# Patient Record
Sex: Female | Born: 1980 | Race: White | Hispanic: No | Marital: Married | Smoking: Never smoker
Health system: Southern US, Community
[De-identification: ages and names within clinical notes are randomized; demographics above are authoritative.]

## PROBLEM LIST (undated history)

## (undated) DIAGNOSIS — I10 Essential (primary) hypertension: Secondary | ICD-10-CM

## (undated) DIAGNOSIS — O09529 Supervision of elderly multigravida, unspecified trimester: Secondary | ICD-10-CM

## (undated) DIAGNOSIS — N970 Female infertility associated with anovulation: Secondary | ICD-10-CM

## (undated) DIAGNOSIS — J45909 Unspecified asthma, uncomplicated: Secondary | ICD-10-CM

## (undated) DIAGNOSIS — O24419 Gestational diabetes mellitus in pregnancy, unspecified control: Secondary | ICD-10-CM

## (undated) DIAGNOSIS — E039 Hypothyroidism, unspecified: Secondary | ICD-10-CM

## (undated) HISTORY — DX: Unspecified asthma, uncomplicated: J45.909

## (undated) HISTORY — DX: Female infertility associated with anovulation: N97.0

## (undated) HISTORY — DX: Essential (primary) hypertension: I10

## (undated) HISTORY — PX: WISDOM TOOTH EXTRACTION: SHX21

---

## 2016-11-14 LAB — OB RESULTS CONSOLE RPR: RPR: NONREACTIVE

## 2016-11-14 LAB — OB RESULTS CONSOLE ANTIBODY SCREEN: Antibody Screen: NEGATIVE

## 2016-11-14 LAB — OB RESULTS CONSOLE HIV ANTIBODY (ROUTINE TESTING): HIV: NONREACTIVE

## 2016-11-14 LAB — OB RESULTS CONSOLE RUBELLA ANTIBODY, IGM: RUBELLA: IMMUNE

## 2016-11-14 LAB — OB RESULTS CONSOLE ABO/RH: RH TYPE: POSITIVE

## 2016-11-14 LAB — OB RESULTS CONSOLE HEPATITIS B SURFACE ANTIGEN: HEP B S AG: NEGATIVE

## 2017-07-07 LAB — OB RESULTS CONSOLE GBS: STREP GROUP B AG: NEGATIVE

## 2017-07-24 ENCOUNTER — Encounter (HOSPITAL_COMMUNITY): Payer: Self-pay

## 2017-07-24 ENCOUNTER — Inpatient Hospital Stay (HOSPITAL_COMMUNITY)
Admission: AD | Admit: 2017-07-24 | Discharge: 2017-07-27 | DRG: 787 | Disposition: A | Discharge status: Home / Self Care | Payer: PRIVATE HEALTH INSURANCE | Source: Ambulatory Visit | Attending: Obstetrics and Gynecology | Admitting: Obstetrics and Gynecology

## 2017-07-24 DIAGNOSIS — O4103X0 Oligohydramnios, third trimester, not applicable or unspecified: Secondary | ICD-10-CM | POA: Diagnosis present

## 2017-07-24 DIAGNOSIS — Z3A39 39 weeks gestation of pregnancy: Secondary | ICD-10-CM | POA: Diagnosis not present

## 2017-07-24 DIAGNOSIS — O134 Gestational [pregnancy-induced] hypertension without significant proteinuria, complicating childbirth: Principal | ICD-10-CM | POA: Diagnosis present

## 2017-07-24 DIAGNOSIS — O99214 Obesity complicating childbirth: Secondary | ICD-10-CM | POA: Diagnosis present

## 2017-07-24 DIAGNOSIS — E039 Hypothyroidism, unspecified: Secondary | ICD-10-CM | POA: Diagnosis present

## 2017-07-24 DIAGNOSIS — E669 Obesity, unspecified: Secondary | ICD-10-CM | POA: Diagnosis present

## 2017-07-24 DIAGNOSIS — O99284 Endocrine, nutritional and metabolic diseases complicating childbirth: Secondary | ICD-10-CM | POA: Diagnosis present

## 2017-07-24 DIAGNOSIS — O365930 Maternal care for other known or suspected poor fetal growth, third trimester, not applicable or unspecified: Secondary | ICD-10-CM | POA: Diagnosis present

## 2017-07-24 DIAGNOSIS — Z6832 Body mass index (BMI) 32.0-32.9, adult: Secondary | ICD-10-CM

## 2017-07-24 DIAGNOSIS — O4100X0 Oligohydramnios, unspecified trimester, not applicable or unspecified: Secondary | ICD-10-CM | POA: Diagnosis present

## 2017-07-24 DIAGNOSIS — O169 Unspecified maternal hypertension, unspecified trimester: Secondary | ICD-10-CM | POA: Diagnosis present

## 2017-07-24 HISTORY — DX: Hypothyroidism, unspecified: E03.9

## 2017-07-24 LAB — COMPREHENSIVE METABOLIC PANEL
ALT: 15 U/L (ref 14–54)
ANION GAP: 12 (ref 5–15)
AST: 21 U/L (ref 15–41)
Albumin: 2.9 g/dL — ABNORMAL LOW (ref 3.5–5.0)
Alkaline Phosphatase: 160 U/L — ABNORMAL HIGH (ref 38–126)
BUN: 9 mg/dL (ref 6–20)
CALCIUM: 8.6 mg/dL — AB (ref 8.9–10.3)
CHLORIDE: 105 mmol/L (ref 101–111)
CO2: 20 mmol/L — ABNORMAL LOW (ref 22–32)
CREATININE: 0.5 mg/dL (ref 0.44–1.00)
Glucose, Bld: 85 mg/dL (ref 65–99)
Potassium: 3.4 mmol/L — ABNORMAL LOW (ref 3.5–5.1)
SODIUM: 137 mmol/L (ref 135–145)
Total Bilirubin: 0.2 mg/dL — ABNORMAL LOW (ref 0.3–1.2)
Total Protein: 6.6 g/dL (ref 6.5–8.1)

## 2017-07-24 LAB — CBC
HCT: 39.9 % (ref 36.0–46.0)
Hemoglobin: 13.7 g/dL (ref 12.0–15.0)
MCH: 30.7 pg (ref 26.0–34.0)
MCHC: 34.3 g/dL (ref 30.0–36.0)
MCV: 89.5 fL (ref 78.0–100.0)
PLATELETS: 270 10*3/uL (ref 150–400)
RBC: 4.46 MIL/uL (ref 3.87–5.11)
RDW: 13.9 % (ref 11.5–15.5)
WBC: 14.2 10*3/uL — AB (ref 4.0–10.5)

## 2017-07-24 LAB — TYPE AND SCREEN
ABO/RH(D): O POS
Antibody Screen: NEGATIVE

## 2017-07-24 LAB — ABO/RH: ABO/RH(D): O POS

## 2017-07-24 MED ORDER — LACTATED RINGERS IV SOLN
500.0000 mL | INTRAVENOUS | Status: DC | PRN
  Administered 2017-07-25: 500 mL via INTRAVENOUS

## 2017-07-24 MED ORDER — FLEET ENEMA 7-19 GM/118ML RE ENEM: 1.0000 | ENEMA | Freq: Every day | RECTAL | Status: DC | PRN

## 2017-07-24 MED ORDER — ACETAMINOPHEN 325 MG PO TABS: 650.0000 mg | ORAL_TABLET | ORAL | Status: DC | PRN

## 2017-07-24 MED ORDER — SOD CITRATE-CITRIC ACID 500-334 MG/5ML PO SOLN
30.0000 mL | ORAL | Status: DC | PRN
  Administered 2017-07-25: 30 mL via ORAL
  Filled 2017-07-24: qty 15

## 2017-07-24 MED ORDER — MISOPROSTOL 25 MCG QUARTER TABLET
25.0000 ug | ORAL_TABLET | ORAL | Status: DC | PRN
  Administered 2017-07-24 – 2017-07-25 (×3): 25 ug via VAGINAL
  Filled 2017-07-24 (×3): qty 1

## 2017-07-24 MED ORDER — LACTATED RINGERS IV SOLN
INTRAVENOUS | Status: DC
  Administered 2017-07-24 – 2017-07-25 (×3): via INTRAVENOUS
  Administered 2017-07-25: 125 mL/h via INTRAVENOUS
  Administered 2017-07-25: 06:00:00 via INTRAVENOUS

## 2017-07-24 MED ORDER — TERBUTALINE SULFATE 1 MG/ML IJ SOLN: 0.2500 mg | Freq: Once | INTRAMUSCULAR | Status: DC | PRN

## 2017-07-24 MED ORDER — OXYCODONE-ACETAMINOPHEN 5-325 MG PO TABS: 2.0000 | ORAL_TABLET | ORAL | Status: DC | PRN

## 2017-07-24 MED ORDER — OXYCODONE-ACETAMINOPHEN 5-325 MG PO TABS: 1.0000 | ORAL_TABLET | ORAL | Status: DC | PRN

## 2017-07-24 MED ORDER — OXYTOCIN BOLUS FROM INFUSION: 500.0000 mL | Freq: Once | INTRAVENOUS | Status: DC

## 2017-07-24 MED ORDER — LIDOCAINE HCL (PF) 1 % IJ SOLN: 30.0000 mL | INTRAMUSCULAR | Status: DC | PRN

## 2017-07-24 MED ORDER — OXYTOCIN 40 UNITS IN LACTATED RINGERS INFUSION - SIMPLE MED
2.5000 [IU]/h | INTRAVENOUS | Status: DC
  Filled 2017-07-24: qty 1000

## 2017-07-24 MED ORDER — ONDANSETRON HCL 4 MG/2ML IJ SOLN: 4.0000 mg | Freq: Four times a day (QID) | INTRAMUSCULAR | Status: DC | PRN

## 2017-07-24 NOTE — Anesthesia Pain Management Evaluation Note (Signed)
  CRNA Pain Management Visit Note  Patient: Donna Page, 36 y.o., female  "Hello I am a member of the anesthesia team at [facility]. We have an anesthesia team available at all times to provide care throughout the hospital, including epidural management and anesthesia for C-section. I don't know your plan for the delivery whether it a natural birth, water birth, IV sedation, nitrous supplementation, doula or epidural, but we want to meet your pain goals."   1.Was your pain managed to your expectations on prior hospitalizations?   No prior hospitalizations  2.What is your expectation for pain management during this hospitalization?     Labor support without medications, Epidural, IV pain meds and Nitrous Oxide  3.How can we help you reach that goal? This is patient's first baby and she open to discussion of all pain control methods. All questions answered.  Record the patient's initial score and the patient's pain goal.   Pain: 0  Pain Goal: 5 The [facility] wants you to be able to say your pain was always managed very well.  Donna Page 07/24/2017

## 2017-07-24 NOTE — Anesthesia Pain Management Evaluation Note (Signed)
  CRNA Pain Management Visit Note  Patient: Donna Page, 36 y.o., female  "Hello I am a member of the anesthesia team at [facility]. We have an anesthesia team available at all times to provide care throughout the hospital, including epidural management and anesthesia for C-section. I don't know your plan for the delivery whether it a natural birth, water birth, IV sedation, nitrous supplementation, doula or epidural, but we want to meet your pain goals."   1.Was your pain managed to your expectations on prior hospitalizations?   No prior hospitalizations  2.What is your expectation for pain management during this hospitalization?     Epidural and IV pain meds  3.How can we help you reach that goal? Be available  Record the patient's initial score and the patient's pain goal.   Pain: 0  Pain Goal: 5 The [facility] wants you to be able to say your pain was always managed very well.  [facility] 07/24/2017

## 2017-07-24 NOTE — H&P (Signed)
Donna Page is a 36 y.o. female presenting for IOL for gestational HTN and oligo with AFI 2 today.. OB History    Gravida Para Term Preterm AB Living   2       1     SAB TAB Ectopic Multiple Live Births   1       0     Past Medical History:  Diagnosis Date  . Hypothyroidism    No past surgical history on file. Family History: family history is not on file. Social History:  reports that she has never smoked. She has never used smokeless tobacco. She reports that she does not use drugs. Her alcohol history is not on file.     Maternal Diabetes: No Genetic Screening: Normal Maternal Ultrasounds/Referrals: Normal Fetal Ultrasounds or other Referrals:  None Maternal Substance Abuse:  No Significant Maternal Medications:  None Significant Maternal Lab Results:  None Other Comments:  None  Review of Systems  Constitutional: Negative.   All other systems reviewed and are negative.  Maternal Medical History:  Fetal activity: Perceived fetal activity is normal.   Last perceived fetal movement was within the past hour.    Prenatal complications: PIH and oligohydramnios.   Prenatal Complications - Diabetes: none.    Dilation: Closed Effacement (%): Thick Station: -3 Exam by:: Montez Morita, RNC Blood pressure (!) 144/83, pulse 83, temperature 99.1 F (37.3 C), temperature source Oral, resp. rate 18, height  (1.676 m), weight 93 kg (205 lb). Maternal Exam:  Uterine Assessment: Contraction strength is mild.  Contraction frequency is rare.   Abdomen: Patient reports no abdominal tenderness. Fetal presentation: vertex  Introitus: Normal vulva. Normal vagina.  Ferning test: negative.  Nitrazine test: negative. Amniotic fluid character: not assessed.  Pelvis: adequate for delivery.   Cervix: Cervix evaluated by digital exam.     Physical Exam  Nursing note and vitals reviewed. Constitutional: She is oriented to person, place, and time. She appears well-developed and  well-nourished.  HENT:  Head: Normocephalic and atraumatic.  Neck: Normal range of motion. Neck supple.  Cardiovascular: Normal rate and regular rhythm.   Respiratory: Breath sounds normal.  GI: Soft. Bowel sounds are normal.  Genitourinary: Vagina normal and uterus normal.  Musculoskeletal: Normal range of motion.  Neurological: She is alert and oriented to person, place, and time. She has normal reflexes.  Skin: Skin is dry.  Psychiatric: She has a normal mood and affect.    Prenatal labs: ABO, Rh: --/--/O POS (10/12 1556) Antibody: NEG (10/12 1556) Rubella: Immune (02/02 0000) RPR: Nonreactive (02/02 0000)  HBsAg: Negative (02/02 0000)  HIV: Non-reactive (02/02 0000)  GBS: Negative (09/25 0000)   Assessment/Plan: Gestational HTn Oligo IOL   Mika Anastasi J 07/24/2017, 8:34 PM

## 2017-07-25 ENCOUNTER — Encounter (HOSPITAL_COMMUNITY)
Admission: AD | Disposition: A | Discharge status: Home / Self Care | Payer: Self-pay | Source: Ambulatory Visit | Attending: Obstetrics and Gynecology

## 2017-07-25 ENCOUNTER — Encounter (HOSPITAL_COMMUNITY): Payer: Self-pay | Admitting: *Deleted

## 2017-07-25 ENCOUNTER — Inpatient Hospital Stay (HOSPITAL_COMMUNITY): Payer: PRIVATE HEALTH INSURANCE | Admitting: Anesthesiology

## 2017-07-25 LAB — CBC
HEMATOCRIT: 38.1 % (ref 36.0–46.0)
Hemoglobin: 13.1 g/dL (ref 12.0–15.0)
MCH: 30.5 pg (ref 26.0–34.0)
MCHC: 34.4 g/dL (ref 30.0–36.0)
MCV: 88.6 fL (ref 78.0–100.0)
Platelets: 248 10*3/uL (ref 150–400)
RBC: 4.3 MIL/uL (ref 3.87–5.11)
RDW: 14 % (ref 11.5–15.5)
WBC: 14.9 10*3/uL — AB (ref 4.0–10.5)

## 2017-07-25 LAB — RPR: RPR Ser Ql: NONREACTIVE

## 2017-07-25 SURGERY — Surgical Case
Anesthesia: Regional | Site: Abdomen | Wound class: Clean Contaminated

## 2017-07-25 MED ORDER — BUPIVACAINE HCL (PF) 0.25 % IJ SOLN
INTRAMUSCULAR | Status: AC
  Filled 2017-07-25: qty 30

## 2017-07-25 MED ORDER — NALBUPHINE HCL 10 MG/ML IJ SOLN: 5.0000 mg | Freq: Once | INTRAMUSCULAR | Status: DC | PRN

## 2017-07-25 MED ORDER — DEXAMETHASONE SODIUM PHOSPHATE 4 MG/ML IJ SOLN
INTRAMUSCULAR | Status: DC | PRN
  Administered 2017-07-25: 4 mg via INTRAVENOUS

## 2017-07-25 MED ORDER — EPHEDRINE SULFATE 50 MG/ML IJ SOLN
INTRAMUSCULAR | Status: DC | PRN
  Administered 2017-07-25 (×2): 25 mg via INTRAVENOUS

## 2017-07-25 MED ORDER — ACETAMINOPHEN 325 MG PO TABS
650.0000 mg | ORAL_TABLET | ORAL | Status: DC | PRN
  Administered 2017-07-25 – 2017-07-27 (×4): 650 mg via ORAL
  Filled 2017-07-25 (×4): qty 2

## 2017-07-25 MED ORDER — PHENYLEPHRINE 40 MCG/ML (10ML) SYRINGE FOR IV PUSH (FOR BLOOD PRESSURE SUPPORT)
80.0000 ug | PREFILLED_SYRINGE | INTRAVENOUS | Status: DC | PRN
  Filled 2017-07-25: qty 10

## 2017-07-25 MED ORDER — SENNOSIDES-DOCUSATE SODIUM 8.6-50 MG PO TABS
2.0000 | ORAL_TABLET | ORAL | Status: DC
  Administered 2017-07-25 – 2017-07-26 (×2): 2 via ORAL
  Filled 2017-07-25 (×2): qty 2

## 2017-07-25 MED ORDER — MENTHOL 3 MG MT LOZG: 1.0000 | LOZENGE | OROMUCOSAL | Status: DC | PRN

## 2017-07-25 MED ORDER — SIMETHICONE 80 MG PO CHEW
80.0000 mg | CHEWABLE_TABLET | Freq: Three times a day (TID) | ORAL | Status: DC
  Administered 2017-07-26 – 2017-07-27 (×3): 80 mg via ORAL
  Filled 2017-07-25 (×3): qty 1

## 2017-07-25 MED ORDER — DIPHENHYDRAMINE HCL 50 MG/ML IJ SOLN: 12.5000 mg | INTRAMUSCULAR | Status: DC | PRN

## 2017-07-25 MED ORDER — ONDANSETRON HCL 4 MG/2ML IJ SOLN
INTRAMUSCULAR | Status: AC
  Filled 2017-07-25: qty 2

## 2017-07-25 MED ORDER — EPHEDRINE 5 MG/ML INJ: 10.0000 mg | INTRAVENOUS | Status: DC | PRN

## 2017-07-25 MED ORDER — NALBUPHINE HCL 10 MG/ML IJ SOLN: 5.0000 mg | INTRAMUSCULAR | Status: DC | PRN

## 2017-07-25 MED ORDER — SODIUM CHLORIDE 0.9 % IJ SOLN
INTRAMUSCULAR | Status: AC
  Filled 2017-07-25: qty 20

## 2017-07-25 MED ORDER — WITCH HAZEL-GLYCERIN EX PADS: 1.0000 "application " | MEDICATED_PAD | CUTANEOUS | Status: DC | PRN

## 2017-07-25 MED ORDER — OXYCODONE HCL 5 MG/5ML PO SOLN: 5.0000 mg | Freq: Once | ORAL | Status: DC | PRN

## 2017-07-25 MED ORDER — SCOPOLAMINE 1 MG/3DAYS TD PT72
1.0000 | MEDICATED_PATCH | Freq: Once | TRANSDERMAL | Status: DC
  Filled 2017-07-25: qty 1

## 2017-07-25 MED ORDER — SODIUM CHLORIDE 0.9 % IR SOLN
Status: DC | PRN
  Administered 2017-07-25: 1

## 2017-07-25 MED ORDER — LEVOTHYROXINE SODIUM 75 MCG PO TABS
75.0000 ug | ORAL_TABLET | Freq: Every day | ORAL | Status: DC
  Filled 2017-07-25 (×3): qty 1

## 2017-07-25 MED ORDER — OXYCODONE-ACETAMINOPHEN 5-325 MG PO TABS: 1.0000 | ORAL_TABLET | ORAL | Status: DC | PRN

## 2017-07-25 MED ORDER — DIBUCAINE 1 % RE OINT: 1.0000 "application " | TOPICAL_OINTMENT | RECTAL | Status: DC | PRN

## 2017-07-25 MED ORDER — MEPERIDINE HCL 25 MG/ML IJ SOLN: 6.2500 mg | INTRAMUSCULAR | Status: DC | PRN

## 2017-07-25 MED ORDER — PROMETHAZINE HCL 25 MG/ML IJ SOLN: 6.2500 mg | INTRAMUSCULAR | Status: DC | PRN

## 2017-07-25 MED ORDER — OXYTOCIN 40 UNITS IN LACTATED RINGERS INFUSION - SIMPLE MED
1.0000 m[IU]/min | INTRAVENOUS | Status: DC
  Administered 2017-07-25: 2 m[IU]/min via INTRAVENOUS

## 2017-07-25 MED ORDER — TETANUS-DIPHTH-ACELL PERTUSSIS 5-2.5-18.5 LF-MCG/0.5 IM SUSP: 0.5000 mL | Freq: Once | INTRAMUSCULAR | Status: DC

## 2017-07-25 MED ORDER — KETOROLAC TROMETHAMINE 30 MG/ML IJ SOLN: 30.0000 mg | Freq: Once | INTRAMUSCULAR | Status: DC | PRN

## 2017-07-25 MED ORDER — SIMETHICONE 80 MG PO CHEW: 80.0000 mg | CHEWABLE_TABLET | ORAL | Status: DC | PRN

## 2017-07-25 MED ORDER — COCONUT OIL OIL: 1.0000 "application " | TOPICAL_OIL | Status: DC | PRN

## 2017-07-25 MED ORDER — EPHEDRINE 5 MG/ML INJ
INTRAVENOUS | Status: AC
  Filled 2017-07-25: qty 10

## 2017-07-25 MED ORDER — LACTATED RINGERS IV SOLN
INTRAVENOUS | Status: DC
  Administered 2017-07-25: 999 mL via INTRAVENOUS

## 2017-07-25 MED ORDER — SODIUM BICARBONATE 8.4 % IV SOLN
INTRAVENOUS | Status: DC | PRN
  Administered 2017-07-25: 2 mL via EPIDURAL
  Administered 2017-07-25: 5 mL via EPIDURAL

## 2017-07-25 MED ORDER — LACTATED RINGERS IV SOLN
INTRAVENOUS | Status: DC | PRN
Start: 2017-07-25 — End: 2017-07-25
  Administered 2017-07-25 (×2): via INTRAVENOUS

## 2017-07-25 MED ORDER — PRENATAL MULTIVITAMIN CH
1.0000 | ORAL_TABLET | Freq: Every day | ORAL | Status: DC
  Administered 2017-07-26 – 2017-07-27 (×2): 1 via ORAL
  Filled 2017-07-25 (×2): qty 1

## 2017-07-25 MED ORDER — TERBUTALINE SULFATE 1 MG/ML IJ SOLN: 0.2500 mg | Freq: Once | INTRAMUSCULAR | Status: DC | PRN

## 2017-07-25 MED ORDER — LACTATED RINGERS IV SOLN
INTRAVENOUS | Status: DC
  Administered 2017-07-25: 150 mL/h via INTRAUTERINE

## 2017-07-25 MED ORDER — DEXAMETHASONE SODIUM PHOSPHATE 4 MG/ML IJ SOLN
INTRAMUSCULAR | Status: AC
  Filled 2017-07-25: qty 1

## 2017-07-25 MED ORDER — METHYLERGONOVINE MALEATE 0.2 MG PO TABS: 0.2000 mg | ORAL_TABLET | ORAL | Status: DC | PRN

## 2017-07-25 MED ORDER — ATROPINE SULFATE 0.4 MG/ML IJ SOLN
INTRAMUSCULAR | Status: AC
  Filled 2017-07-25: qty 1

## 2017-07-25 MED ORDER — HYDROMORPHONE HCL 1 MG/ML IJ SOLN: 0.2500 mg | INTRAMUSCULAR | Status: DC | PRN

## 2017-07-25 MED ORDER — FENTANYL 2.5 MCG/ML BUPIVACAINE 1/10 % EPIDURAL INFUSION (WH - ANES)
14.0000 mL/h | INTRAMUSCULAR | Status: DC | PRN
Start: 2017-07-25 — End: 2017-07-25
  Administered 2017-07-25 (×2): 14 mL/h via EPIDURAL
  Filled 2017-07-25 (×2): qty 100

## 2017-07-25 MED ORDER — IBUPROFEN 600 MG PO TABS
600.0000 mg | ORAL_TABLET | Freq: Four times a day (QID) | ORAL | Status: DC
  Administered 2017-07-25 – 2017-07-27 (×8): 600 mg via ORAL
  Filled 2017-07-25 (×8): qty 1

## 2017-07-25 MED ORDER — NALBUPHINE HCL 10 MG/ML IJ SOLN
5.0000 mg | INTRAMUSCULAR | Status: DC | PRN
  Administered 2017-07-25: 5 mg via SUBCUTANEOUS
  Filled 2017-07-25: qty 1

## 2017-07-25 MED ORDER — DIPHENHYDRAMINE HCL 25 MG PO CAPS: 25.0000 mg | ORAL_CAPSULE | Freq: Four times a day (QID) | ORAL | Status: DC | PRN

## 2017-07-25 MED ORDER — NALOXONE HCL 0.4 MG/ML IJ SOLN: 0.4000 mg | INTRAMUSCULAR | Status: DC | PRN

## 2017-07-25 MED ORDER — BUPIVACAINE HCL (PF) 0.25 % IJ SOLN
INTRAMUSCULAR | Status: DC | PRN
  Administered 2017-07-25: 20 mL

## 2017-07-25 MED ORDER — LACTATED RINGERS IV SOLN
500.0000 mL | Freq: Once | INTRAVENOUS | Status: AC
  Administered 2017-07-25: 500 mL via INTRAVENOUS

## 2017-07-25 MED ORDER — LACTATED RINGERS IV SOLN: 500.0000 mL | Freq: Once | INTRAVENOUS | Status: DC

## 2017-07-25 MED ORDER — ATROPINE SULFATE 0.4 MG/ML IJ SOLN
INTRAMUSCULAR | Status: DC | PRN
  Administered 2017-07-25: 0.4 mg via INTRAVENOUS

## 2017-07-25 MED ORDER — MORPHINE SULFATE (PF) 0.5 MG/ML IJ SOLN
INTRAMUSCULAR | Status: DC | PRN
  Administered 2017-07-25: 4 mg via EPIDURAL

## 2017-07-25 MED ORDER — KETOROLAC TROMETHAMINE 30 MG/ML IJ SOLN
INTRAMUSCULAR | Status: AC
  Administered 2017-07-25: 30 mg
  Filled 2017-07-25: qty 1

## 2017-07-25 MED ORDER — OXYCODONE-ACETAMINOPHEN 5-325 MG PO TABS: 2.0000 | ORAL_TABLET | ORAL | Status: DC | PRN

## 2017-07-25 MED ORDER — SODIUM CHLORIDE 0.9% FLUSH: 3.0000 mL | INTRAVENOUS | Status: DC | PRN

## 2017-07-25 MED ORDER — OXYTOCIN 40 UNITS IN LACTATED RINGERS INFUSION - SIMPLE MED: 2.5000 [IU]/h | INTRAVENOUS | Status: AC

## 2017-07-25 MED ORDER — SCOPOLAMINE 1 MG/3DAYS TD PT72
MEDICATED_PATCH | TRANSDERMAL | Status: AC
  Filled 2017-07-25: qty 1

## 2017-07-25 MED ORDER — LACTATED RINGERS IV SOLN
INTRAVENOUS | Status: DC | PRN
  Administered 2017-07-25: 13:00:00 via INTRAVENOUS

## 2017-07-25 MED ORDER — DEXAMETHASONE SODIUM PHOSPHATE 10 MG/ML IJ SOLN
INTRAMUSCULAR | Status: AC
  Filled 2017-07-25: qty 2

## 2017-07-25 MED ORDER — SCOPOLAMINE 1 MG/3DAYS TD PT72
MEDICATED_PATCH | TRANSDERMAL | Status: DC | PRN
  Administered 2017-07-25: 1 via TRANSDERMAL

## 2017-07-25 MED ORDER — ZOLPIDEM TARTRATE 5 MG PO TABS: 5.0000 mg | ORAL_TABLET | Freq: Every evening | ORAL | Status: DC | PRN

## 2017-07-25 MED ORDER — OXYCODONE HCL 5 MG PO TABS: 5.0000 mg | ORAL_TABLET | Freq: Once | ORAL | Status: DC | PRN

## 2017-07-25 MED ORDER — ONDANSETRON HCL 4 MG/2ML IJ SOLN: 4.0000 mg | Freq: Three times a day (TID) | INTRAMUSCULAR | Status: DC | PRN

## 2017-07-25 MED ORDER — METHYLERGONOVINE MALEATE 0.2 MG/ML IJ SOLN: 0.2000 mg | INTRAMUSCULAR | Status: DC | PRN

## 2017-07-25 MED ORDER — PHENYLEPHRINE 40 MCG/ML (10ML) SYRINGE FOR IV PUSH (FOR BLOOD PRESSURE SUPPORT): 80.0000 ug | PREFILLED_SYRINGE | INTRAVENOUS | Status: DC | PRN

## 2017-07-25 MED ORDER — NALOXONE HCL 2 MG/2ML IJ SOSY
1.0000 ug/kg/h | PREFILLED_SYRINGE | INTRAVENOUS | Status: DC | PRN
  Filled 2017-07-25: qty 2

## 2017-07-25 MED ORDER — LIDOCAINE HCL (PF) 1 % IJ SOLN
INTRAMUSCULAR | Status: DC | PRN
Start: 2017-07-25 — End: 2017-07-25
  Administered 2017-07-25: 5 mL via EPIDURAL
  Administered 2017-07-25: 3 mL via EPIDURAL
  Administered 2017-07-25: 2 mL via EPIDURAL

## 2017-07-25 MED ORDER — MORPHINE SULFATE (PF) 0.5 MG/ML IJ SOLN
INTRAMUSCULAR | Status: AC
  Filled 2017-07-25: qty 10

## 2017-07-25 MED ORDER — SIMETHICONE 80 MG PO CHEW
80.0000 mg | CHEWABLE_TABLET | ORAL | Status: DC
  Administered 2017-07-25 – 2017-07-26 (×2): 80 mg via ORAL
  Filled 2017-07-25 (×2): qty 1

## 2017-07-25 MED ORDER — ONDANSETRON HCL 4 MG/2ML IJ SOLN
INTRAMUSCULAR | Status: DC | PRN
  Administered 2017-07-25: 4 mg via INTRAVENOUS

## 2017-07-25 MED ORDER — DIPHENHYDRAMINE HCL 25 MG PO CAPS: 25.0000 mg | ORAL_CAPSULE | ORAL | Status: DC | PRN

## 2017-07-25 MED ORDER — CEFAZOLIN SODIUM-DEXTROSE 2-3 GM-% IV SOLR
INTRAVENOUS | Status: DC | PRN
  Administered 2017-07-25: 2 g via INTRAVENOUS

## 2017-07-25 MED ORDER — OXYTOCIN 10 UNIT/ML IJ SOLN
INTRAVENOUS | Status: DC | PRN
  Administered 2017-07-25: 40 [IU] via INTRAVENOUS
  Administered 2017-07-25: 13:00:00 via INTRAVENOUS

## 2017-07-25 SURGICAL SUPPLY — 35 items
CHLORAPREP W/TINT 26ML (MISCELLANEOUS) ×3 IMPLANT
CLAMP CORD UMBIL (MISCELLANEOUS) IMPLANT
CLOTH BEACON ORANGE TIMEOUT ST (SAFETY) ×3 IMPLANT
CONTAINER PREFILL 10% NBF 15ML (MISCELLANEOUS) IMPLANT
DERMABOND ADVANCED (GAUZE/BANDAGES/DRESSINGS) ×2
DERMABOND ADVANCED .7 DNX12 (GAUZE/BANDAGES/DRESSINGS) ×1 IMPLANT
DRSG OPSITE POSTOP 4X10 (GAUZE/BANDAGES/DRESSINGS) ×3 IMPLANT
ELECT REM PT RETURN 9FT ADLT (ELECTROSURGICAL) ×3
ELECTRODE REM PT RTRN 9FT ADLT (ELECTROSURGICAL) ×1 IMPLANT
EXTRACTOR VACUUM M CUP 4 TUBE (SUCTIONS) IMPLANT
EXTRACTOR VACUUM M CUP 4' TUBE (SUCTIONS)
GLOVE BIO SURGEON STRL SZ7.5 (GLOVE) ×3 IMPLANT
GLOVE BIOGEL PI IND STRL 7.0 (GLOVE) ×1 IMPLANT
GLOVE BIOGEL PI INDICATOR 7.0 (GLOVE) ×2
GOWN STRL REUS W/TWL LRG LVL3 (GOWN DISPOSABLE) ×6 IMPLANT
KIT ABG SYR 3ML LUER SLIP (SYRINGE) IMPLANT
NEEDLE HYPO 22GX1.5 SAFETY (NEEDLE) ×3 IMPLANT
NEEDLE HYPO 25X5/8 SAFETYGLIDE (NEEDLE) IMPLANT
NEEDLE SPNL 20GX3.5 QUINCKE YW (NEEDLE) IMPLANT
NS IRRIG 1000ML POUR BTL (IV SOLUTION) ×3 IMPLANT
PACK C SECTION WH (CUSTOM PROCEDURE TRAY) ×3 IMPLANT
PENCIL SMOKE EVAC W/HOLSTER (ELECTROSURGICAL) ×3 IMPLANT
SUT MNCRL 0 VIOLET CTX 36 (SUTURE) ×2 IMPLANT
SUT MNCRL AB 3-0 PS2 27 (SUTURE) IMPLANT
SUT MON AB 2-0 CT1 27 (SUTURE) ×3 IMPLANT
SUT MON AB-0 CT1 36 (SUTURE) ×6 IMPLANT
SUT MONOCRYL 0 CTX 36 (SUTURE) ×4
SUT PLAIN 0 NONE (SUTURE) IMPLANT
SUT PLAIN 2 0 (SUTURE)
SUT PLAIN 2 0 XLH (SUTURE) IMPLANT
SUT PLAIN ABS 2-0 CT1 27XMFL (SUTURE) IMPLANT
SYR 20CC LL (SYRINGE) IMPLANT
SYR CONTROL 10ML LL (SYRINGE) ×3 IMPLANT
TOWEL OR 17X24 6PK STRL BLUE (TOWEL DISPOSABLE) ×3 IMPLANT
TRAY FOLEY BAG SILVER LF 14FR (SET/KITS/TRAYS/PACK) ×3 IMPLANT

## 2017-07-25 NOTE — Op Note (Signed)
Cesarean Section Procedure Note  Indications: non-reassuring fetal status  Pre-operative Diagnosis: 39 week 1 day pregnancy.  Post-operative Diagnosis: same  Surgeon: Lenoard Aden   Assistants: Renae Fickle, CNM  Anesthesia: Epidural anesthesia and Local anesthesia 0.25.% bupivacaine  ASA Class: 2  Procedure Details  The patient was seen in the Holding Room. The risks, benefits, complications, treatment options, and expected outcomes were discussed with the patient.  The patient concurred with the proposed plan, giving informed consent. The risks of anesthesia, infection, bleeding and possible injury to other organs discussed. Injury to bowel, bladder, or ureter with possible need for repair discussed. Possible need for transfusion with secondary risks of hepatitis or HIV acquisition discussed. Post operative complications to include but not limited to DVT, PE and Pneumonia noted. The site of surgery properly noted/marked. The patient was taken to Operating Room # 9, identified as Donna Page and the procedure verified as C-Section Delivery. A Time Out was held and the above information confirmed.  After induction of anesthesia, the patient was draped and prepped in the usual sterile manner. A Pfannenstiel incision was made and carried down through the subcutaneous tissue to the fascia. Fascial incision was made and extended transversely using Mayo scissors. The fascia was separated from the underlying rectus tissue superiorly and inferiorly. The peritoneum was identified and entered. Peritoneal incision was extended longitudinally. The utero-vesical peritoneal reflection was incised transversely and the bladder flap was bluntly freed from the lower uterine segment. A low transverse uterine incision(Kerr hysterotomy) was made. Delivered from OP presentation was a  female with Apgar scores of 8 at one minute and 9 at five minutes. Bulb suctioning gently performed. Neonatal team in attendance.After the  umbilical cord was clamped and cut cord blood was obtained for evaluation. The placenta was removed intact and appeared normal. The uterus was curetted with a dry lap pack. Good hemostasis was noted.The uterine outline, tubes and ovaries appeared normal. The uterine incision was closed with running locked sutures of 0 Monocryl x 2 layers. Hemostasis was observed. Lavage was carried out until clear.The parietal peritoneum was closed with a running 2-0 Monocryl suture. The fascia was then reapproximated with running sutures of 0 Monocryl. The skin was reapproximated with 4-0 vicryl after 2-0 plain in a running fashion.  Instrument, sponge, and needle counts were correct prior the abdominal closure and at the conclusion of the case.   Findings: FTLM, OP, anterior placenta  Estimated Blood Loss:  1100         Drains: foley                 Specimens: placenta                  Complications:  None; patient tolerated the procedure well.         Disposition: na         Condition: stable  Attending Attestation: I performed the procedure.

## 2017-07-25 NOTE — Lactation Note (Addendum)
This note was copied from a baby's chart. Lactation Consultation Note  Patient Name: Donna Page [id number] Date: 07/25/2017 Reason for consult: Initial assessment   P1, Baby 9 hours old.  Baby recently returned from nursery and was given formula to help stabilize temperature per mom. Temperature in mother's room turned up to 70 degree F. Mother stated she was sweaty so turned fan on for mother. Mother has short shaft nipples and tried latching earlier with NS given to her by RN. Baby was spitty while LC in room. Had mother prepump and attempted latching in football hold.  Baby did suck for a few minutes off and on but did not seem interested in sustaining latch. Encouraged mother to compress breast with teacup hold to achieve a deeper latch and compress breast during feeding.  Baby full now from earlier formula. Explained to mother that infant is in transition.  Encouraged STS once temp stable and hand express. Mom encouraged to feed baby 8-12 times/24 hours and with feeding cues.  Mom made aware of O/P services, breastfeeding support groups, community resources, and our phone # for post-discharge questions.      Maternal Data Has patient been taught Hand Expression?: Yes Does the patient have breastfeeding experience prior to this delivery?: No  Feeding Feeding Type: Breast Fed Length of feed:  (few sucks)  LATCH Score Latch: Repeated attempts needed to sustain latch, nipple held in mouth throughout feeding, stimulation needed to elicit sucking reflex.  Audible Swallowing: None  Type of Nipple: Everted at rest and after stimulation (short shaft)  Comfort (Breast/Nipple): Soft / non-tender  Hold (Positioning): Assistance needed to correctly position infant at breast and maintain latch.  LATCH Score: 6  Interventions Interventions: Breast feeding basics reviewed;Assisted with latch;Pre-pump if needed;Adjust position;Breast compression  Lactation Tools Discussed/Used      Consult Status Consult Status: Follow-up Date: 07/26/17 Follow-up type: In-patient    Dahlia Byes [facility] 07/25/2017, 10:27 PM

## 2017-07-25 NOTE — Progress Notes (Signed)
Dr Billy Coast in room. MD agreed with RN to increase Pitocin by 1 mu/mn

## 2017-07-25 NOTE — Progress Notes (Signed)
Patient ID: Donna Page, female   DOB: 03-19-1981, 36 y.o.   MRN: [id number] Comfortable. BP (!) 144/84   Pulse 78   Temp 98.7 F (37.1 C) (Oral)   Resp 16   Ht  (1.676 m)   Wt 93 kg (205 lb)   SpO2 97%   BMI 33.09 kg/m   Category 1 tracing AROM- minimal fluid noted IUPC placed without difficulty Continue Pitocin, may need amnioinfusion

## 2017-07-25 NOTE — Anesthesia Procedure Notes (Signed)
Epidural Patient location during procedure: OB Start time: 07/25/2017 3:42 AM End time: 07/25/2017 3:47 AM  Staffing Anesthesiologist: Cecile Hearing Performed: anesthesiologist   Preanesthetic Checklist Completed: patient identified, pre-op evaluation, timeout performed, IV checked, risks and benefits discussed and monitors and equipment checked  Epidural Patient position: sitting Prep: DuraPrep Patient monitoring: blood pressure and continuous pulse ox Approach: midline Location: L3-L4 Injection technique: LOR air  Needle:  Needle type: Tuohy  Needle gauge: 17 G Needle length: 9 cm Needle insertion depth: 4 cm Catheter size: 19 Gauge Catheter at skin depth: 10 cm Test dose: negative and Other (1% Lidocaine)  Additional Notes Patient identified.  Risk benefits discussed including failed block, incomplete pain control, headache, nerve damage, paralysis, blood pressure changes, nausea, vomiting, reactions to medication both toxic or allergic, and postpartum back pain.  Patient expressed understanding and wished to proceed.  All questions were answered.  Sterile technique used throughout procedure and epidural site dressed with sterile barrier dressing. No paresthesia or other complications noted. The patient did not experience any signs of intravascular injection such as tinnitus or metallic taste in mouth nor signs of intrathecal spread such as rapid motor block. Please see nursing notes for vital signs. Reason for block:procedure for pain

## 2017-07-25 NOTE — Progress Notes (Signed)
Amnioinfusion stopped

## 2017-07-25 NOTE — Transfer of Care (Signed)
Immediate Anesthesia Transfer of Care Note  Patient: Denetria Luevanos  Procedure(s) Performed: CESAREAN SECTION (N/A Abdomen)  Patient Location: PACU  Anesthesia Type:Epidural  Level of Consciousness: awake, alert  and oriented  Airway & Oxygen Therapy: Patient Spontanous Breathing  Post-op Assessment: Report given to RN and Post -op Vital signs reviewed and stable  Post vital signs: Reviewed and stable  Last Vitals:  Vitals:   07/25/17 1226 07/25/17 1345  BP:  (P) 113/72  Pulse:  97  Resp: 20 13  Temp:    SpO2:  97%    Last Pain:  Vitals:   07/25/17 1200  TempSrc: Oral  PainSc:          Complications: no complications

## 2017-07-25 NOTE — Anesthesia Preprocedure Evaluation (Addendum)
Anesthesia Evaluation  Patient identified by MRN, date of birth, ID band Patient awake    Reviewed: Allergy & Precautions, NPO status , Patient's Chart, lab work & pertinent test results  Airway Mallampati: II  TM Distance: >3 FB Neck ROM: Full    Dental  (+) Teeth Intact, Dental Advisory Given   Pulmonary neg pulmonary ROS,    Pulmonary exam normal breath sounds clear to auscultation       Cardiovascular hypertension (GHTN), Normal cardiovascular exam Rhythm:Regular Rate:Normal     Neuro/Psych negative neurological ROS     GI/Hepatic negative GI ROS, Neg liver ROS,   Endo/Other  Hypothyroidism Obesity   Renal/GU negative Renal ROS     Musculoskeletal negative musculoskeletal ROS (+)   Abdominal   Peds  Hematology negative hematology ROS (+) Ply 248k   Anesthesia Other Findings Day of surgery medications reviewed with the patient.  Reproductive/Obstetrics (+) Pregnancy                             Anesthesia Physical Anesthesia Plan  ASA: III and emergent  Anesthesia Plan: Epidural   Post-op Pain Management:    Induction:   PONV Risk Score and Plan: 2 and Treatment may vary due to age or medical condition and Ondansetron  Airway Management Planned:   Additional Equipment:   Intra-op Plan:   Post-operative Plan:   Informed Consent: I have reviewed the patients History and Physical, chart, labs and discussed the procedure including the risks, benefits and alternatives for the proposed anesthesia with the patient or authorized representative who has indicated his/her understanding and acceptance.   Dental advisory given  Plan Discussed with: CRNA  Anesthesia Plan Comments:        Anesthesia Quick Evaluation

## 2017-07-25 NOTE — Progress Notes (Signed)
Patient ID: Donna Page, female   DOB: 04-19-1981, 36 y.o.   MRN: [id number] Comfortable BP 135/69   Pulse 90   Temp 98.9 F (37.2 C) (Oral)   Resp 18   Ht  (1.676 m)   Wt 93 kg (205 lb)   SpO2 97%   BMI 33.09 kg/m   Inc frequency of variable decels and occ late decel BTBV 5-25, Category 2 tracing Start amnioinfusion and watch FHR closely

## 2017-07-25 NOTE — Progress Notes (Signed)
Donna Page is a 36 y.o. G2P0010 at [redacted]w[redacted]d by LMP admitted for induction of labor due to Low amniotic fluid. and Hypertension.  Subjective: comfortable  Objective: BP 136/76   Pulse 72   Temp 98.7 F (37.1 C) (Oral)   Resp 20   Ht  (1.676 m)   Wt 93 kg (205 lb)   SpO2 97%   BMI 33.09 kg/m  No intake/output data recorded. Total I/O In: -  Out: 600 [Urine:600]  FHT:  FHR: 150s bpm, variability: moderate,  accelerations:  Present,  decelerations:  Present rare, non repetitive late decel UC:   regular, every 3 minutes SVE:   Dilation: 3 Effacement (%): 60 Station: -2 Exam by:: s grindstaff rn   Labs: Lab Results  Component Value Date   WBC 14.9 (H) 07/25/2017   HGB 13.1 07/25/2017   HCT 38.1 07/25/2017   MCV 88.6 07/25/2017   PLT 248 07/25/2017    Assessment / Plan: Induction of labor due to gestational hypertension and oligo,  progressing well on pitocin  Labor: Progressing on Pitocin, will continue to increase then AROM Preeclampsia:  no signs or symptoms of toxicity Fetal Wellbeing:  Category I and Category II Pain Control:  Epidural I/D:  n/a Anticipated MOD:  guarded. Watch FH closely  Fountain Derusha J 07/25/2017, 8:52 AM

## 2017-07-25 NOTE — Consult Note (Signed)
Neonatology Note:   Attendance at C-section:    I was asked by Dr. Taavon to attend this primary C/S at term due to oligohydramnios, IUGR infant, and NRFHR. The mother is a G2P0A1 O pos, GBS neg with gestational hypertension. ROM 4 hours prior to delivery, fluid clear. Infant vigorous with good spontaneous cry and tone. Delayed cord clamping was done. Needed only minimal bulb suctioning. Ap 9/9. Lungs clear to ausc in DR. To CN to care of Pediatrician.   Nemiah Kissner C. Latrece Nitta, MD 

## 2017-07-26 LAB — CBC
HEMATOCRIT: 29.6 % — AB (ref 36.0–46.0)
HEMOGLOBIN: 10 g/dL — AB (ref 12.0–15.0)
MCH: 30.9 pg (ref 26.0–34.0)
MCHC: 34.1 g/dL (ref 30.0–36.0)
MCV: 90.5 fL (ref 78.0–100.0)
Platelets: 227 10*3/uL (ref 150–400)
RBC: 3.27 MIL/uL — AB (ref 3.87–5.11)
RDW: 14.1 % (ref 11.5–15.5)
WBC: 21.2 10*3/uL — AB (ref 4.0–10.5)

## 2017-07-26 NOTE — Anesthesia Postprocedure Evaluation (Signed)
Anesthesia Post Note  Patient: Donna Page  Procedure(s) Performed: CESAREAN SECTION (N/A Abdomen)     Patient location during evaluation: PACU Anesthesia Type: Epidural Level of consciousness: awake and alert Pain management: pain level controlled Vital Signs Assessment: post-procedure vital signs reviewed and stable Respiratory status: spontaneous breathing and respiratory function stable Cardiovascular status: blood pressure returned to baseline and stable Postop Assessment: epidural receding Anesthetic complications: no    Last Vitals:  Vitals:   07/26/17 0250 07/26/17 0542  BP: 136/81 130/70  Pulse: 85 80  Resp: 18 18  Temp: 37.1 C 36.6 C  SpO2: 100% 99%    Last Pain:  Vitals:   07/26/17 0549  TempSrc:   PainSc: 1    Pain Goal:                 Lewie Loron

## 2017-07-26 NOTE — Progress Notes (Signed)
Subjective: POD# 1 Information for the patient's newborn:  Donna Page, Donna Page [[id number]]  female  circ planned, waiting till tomorrow to verify insurance coverage Baby name: Maddox  Reports feeling well. Feeding: breast Patient reports tolerating PO.  Breast symptoms: working on latch but getting comfortable with breastfeeding Pain controlled with PO meds Denies HA/SOB/C/P/N/V/dizziness. Flatus present. She reports vaginal bleeding as normal, without clots.  She is ambulating, urinating without difficulty.     Objective:   VS:    Vitals:   07/25/17 1830 07/25/17 2240 07/26/17 0250 07/26/17 0542  BP: 138/80  136/81 130/70  Pulse: 90  85 80  Resp: Temp: 99.1 F (37.3 C) 98.6 F (37 C) 98.8 F (37.1 C) 97.9 F (36.6 C)  TempSrc:      SpO2: 96% 99% 100% 99%  Weight:      Height:         Intake/Output Summary (Last 24 hours) at 07/26/17 0833 Last data filed at 07/26/17 0655  Gross per 24 hour  Intake             3500 ml  Output             3490 ml  Net               10 ml        Recent Labs  07/25/17 0322 07/26/17 0546  WBC 14.9* 21.2*  HGB 13.1 10.0*  HCT 38.1 29.6*  PLT 248 227     Blood type: --/--/O POS, O POS (10/12 1556)  Rubella: Immune (02/02 0000)     Physical Exam:  General: alert, cooperative and no distress CV: Regular rate and rhythm Resp: clear Abdomen: soft, nontender, normal bowel sounds Incision: clean, dry and intact Uterine Fundus: firm, below umbilicus, nontender Lochia: minimal Ext: trace edema, no redness or tenderness in the calves or thighs      Assessment/Plan: 36 y.o.   POD# 1. [id number]                  Principal Problem:   Postpartum care following cesarean delivery 10/13 Active Problems:   Hypertension affecting pregnancy   Cesarean delivery delivered - Indication: NRFHT, remote from delivery   Doing well, stable.     Normotensive since delivery, no PEC neural s/s         Advance diet as  tolerated Encourage rest when baby rests Breastfeeding support Encourage to ambulate Routine post-op care  Neta Mends, CNM, MSN 07/26/2017, 8:33 AM

## 2017-07-26 NOTE — Anesthesia Postprocedure Evaluation (Signed)
Anesthesia Post Note  Patient: Princesa Willig  Procedure(s) Performed: CESAREAN SECTION (N/A Abdomen)     Patient location during evaluation: Mother Baby Anesthesia Type: Epidural Level of consciousness: awake and alert and oriented Pain management: pain level controlled Vital Signs Assessment: post-procedure vital signs reviewed and stable Respiratory status: spontaneous breathing and nonlabored ventilation Cardiovascular status: stable Postop Assessment: no headache, no backache, patient able to bend at knees, no signs of nausea or vomiting and adequate PO intake Anesthetic complications: no    Last Vitals:  Vitals:   07/26/17 0250 07/26/17 0542  BP: 136/81 130/70  Pulse: 85 80  Resp: 18 18  Temp: 37.1 C 36.6 C  SpO2: 100% 99%    Last Pain:  Vitals:   07/26/17 0549  TempSrc:   PainSc: 1    Pain Goal:                 Madison Hickman

## 2017-07-26 NOTE — Addendum Note (Signed)
Addendum  created 07/26/17 0827 by Shanon Payor, CRNA   Sign clinical note

## 2017-07-27 DIAGNOSIS — O4100X0 Oligohydramnios, unspecified trimester, not applicable or unspecified: Secondary | ICD-10-CM | POA: Diagnosis present

## 2017-07-27 LAB — BIRTH TISSUE RECOVERY COLLECTION (PLACENTA DONATION)

## 2017-07-27 MED ORDER — FERROUS SULFATE 325 (65 FE) MG PO TABS: 325.0000 mg | ORAL_TABLET | Freq: Every day | ORAL | 3 refills | Status: DC

## 2017-07-27 MED ORDER — IBUPROFEN 600 MG PO TABS: 600.0000 mg | ORAL_TABLET | Freq: Four times a day (QID) | ORAL | 0 refills | Status: DC

## 2017-07-27 NOTE — Lactation Note (Signed)
This note was copied from a baby's chart. ctation Consultation Note  Patient Name: Donna Page [id number] Date: 07/27/2017 Reason for consult: Follow-up assessment;1st time breastfeeding;Infant < 6lbs   Follow up with mom of 49 hour old infant. Infant with 8 BF for 10-20 minutes (using NS), formula x 8 via bottle (3-20 cc) EBM x 1 of 6 cc, 3 voids and 4 stools in the last 25 hours. Infant weight 5 lb 9.2 oz with 3% weight loss since birth (.2 oz weight loss in last 24 hours).   Mom reports she feels infant is feeding well. She report she is using a NS with all feedings. Mom reports she began pumping this morning and is obtaining breast milk to feed infant. Mom was shown how to assemble, disassemble and clean pump parts. Enc mom to wash after every use.   Mom reports infant recently returned form circumcision, discussed with mom that infant may want to sleep, enc her to not allow infant to go more than 3 hours between feedings. Mom reports she does not have a pump at home and her insurance does not cover one. Reviewed purchase of a personal pump or renting a Symphony from [employer] before discharge home.   Feeding plan written on the board in room: 1. Breast feed infant at the breast at least every 3 hours and with feeding cues using the nipple shield with each feeding 2. Supplement infant with EBM and/or formula after each breast feeding 3. Pump for 15 minutes with DEBP on Initiate setting 4. Hand express post pumping 5. Rest between feedings 6. Call out for assistance as needed.   Mom reports no further questions/concerns at this time. Enc mom to call for assistance as needed. Praised mom for all of her feeding efforts.    Maternal Data Formula Feeding for Exclusion: No Has patient been taught Hand Expression?: Yes Does the patient have breastfeeding experience prior to this delivery?: No  Feeding Feeding Type: Breast Fed Length of feed: 30 min  LATCH Score                   Interventions    Lactation Tools Discussed/Used WIC Program: No Pump Review: Setup, frequency, and cleaning;Milk Storage Initiated by:: Reviewed and encouraged every 2-3 hours post BF   Consult Status Consult Status: Follow-up Date: 07/28/17 Follow-up type: In-patient    Donna Page 07/27/2017, 2:46 PM

## 2017-07-27 NOTE — Discharge Summary (Signed)
Obstetric Discharge Summary   Patient Name: Donna Page DOB: [birth year] MRN: [id number]  Date of Admission: 07/24/2017 Date of Discharge: 07/27/2017 Date of Delivery: 07/25/17 Gestational Age at Delivery: [redacted]w[redacted]d  Primary OB: [facility] - Dr. Billy Coast  Antepartum complications:  - Gestational HTN - Oligohydramnios  - Hypothyroidism - on Synthroid  daily  - Infertility: Clomid pregnancy   Prenatal Labs:  ABO, Rh: --/--/O POS (10/12 1556) Antibody: NEG (10/12 1556) Rubella: Immune (02/02 0000) RPR: Nonreactive (02/02 0000)  HBsAg: Negative (02/02 0000)  HIV: Non-reactive (02/02 0000)  GBS: Negative (09/25 0000)  Admitting Diagnosis:   Secondary Diagnoses: Patient Active Problem List   Diagnosis Date Noted  . Oligohydramnios, delivered 07/27/2017  . Cesarean delivery delivered - Indication: NRFHT, remote from delivery 07/25/2017  . Postpartum care following cesarean delivery 10/13 07/25/2017  . Hypertension affecting pregnancy 07/24/2017   Complications: NRFHR, proceed to C/S  Date of Delivery: 07/25/17 Delivered By: Dr. Billy Coast D. Renae Fickle CNM assist Delivery Type: primary cesarean section, low transverse incision  Newborn Data: Live born female  Birth Weight: 5 lb 12.4 oz (2620 g) APGAR: 9, 9  Newborn Delivery   Birth date/time:  07/25/2017 12:57:00 Delivery type:  C-Section, Low Transverse  C-section categorization:  Primary     Postpartum Course  (Cesarean Section):  Patient had an uncomplicated postpartum course.  By time of discharge on POD#2, her pain was controlled on oral pain medications; she had appropriate lochia and was ambulating, voiding without difficulty, tolerating regular diet and passing flatus.   She was deemed stable for discharge to home.     Labs: CBC Latest Ref Rng & Units 07/26/2017 07/25/2017 07/24/2017  WBC 4.0 - 10.5 K/uL 21.2(H) 14.9(H) 14.2(H)  Hemoglobin 12.0 - 15.0 g/dL 10.0(L) 13.1 13.7  Hematocrit 36.0 - 46.0 % 29.6(L)  38.1 39.9  Platelets 150 - 400 K/uL 227 248 270   O POS  Physical exam:  BP 127/76 (BP Location: Left Arm)   Pulse 88   Temp 98.1 F (36.7 C) (Oral)   Resp 18   Ht  (1.676 m)   Wt 93 kg (205 lb)   SpO2 99%   Breastfeeding? Unknown   BMI 33.09 kg/m  General: alert and no distress Pulm: normal respiratory effort Lochia: appropriate Abdomen: soft, NT Uterine Fundus: firm, below umbilicus Perineum: healing well, no significant erythema, no significant edema Incision: c/d/i, healing well, no significant drainage, no dehiscence, no significant erythema Extremities: No evidence of DVT seen on physical exam. No lower extremity edema.   Disposition: stable, discharge to home Baby Feeding: breast milk and formula Baby Disposition: home with mom  Contraception: NuvaRing  Rh Immune globulin given: N/A  Rubella vaccine given: N/A Tdap vaccine given in AP or PP setting: 05/26/17 Flu vaccine given in AP or PP setting: 07/14/17   Plan:  Donna Page was discharged to home in good condition. Follow-up appointment at [facility] in 6 weeks.  Discharge Instructions: Per After Visit Summary. Activity: Advance as tolerated. Pelvic rest for 6 weeks.  Refer to After Visit Summary Diet: Regular, Heart Healthy Discharge Medications: Allergies as of 07/27/2017   No Known Allergies     Medication List    TAKE these medications   ferrous sulfate 325 (65 FE) MG tablet Take 1 tablet (325 mg total) by mouth daily.   ibuprofen 600 MG tablet Commonly known as:  ADVIL,MOTRIN Take 1 tablet (600 mg total) by mouth every 6 (six) hours.   levothyroxine 75 MCG tablet  Commonly known as:  SYNTHROID, LEVOTHROID Take 75 mcg by mouth daily before breakfast.   multivitamin-prenatal 27-0.8 MG Tabs tablet Take 1 tablet by mouth every evening.      Outpatient follow up:  Follow-up Information    Olivia Mackie, MD. Schedule an appointment as soon as possible for a visit in 6 week(s).    Specialty:  Obstetrics and Gynecology Why:  Postpartum visit, needs smart start nurse visit in 1 week for BP check  Contact information: [address] [place] [address] [phone]           Signed:  Carlean Jews, MSN, CNM [facility]

## 2017-07-27 NOTE — Progress Notes (Signed)
POSTOPERATIVE DAY # 2 S/P Primary LTCS for NRFHR, IOL for GHTN and Oligohydramnios   S:         Reports feeling good, minimal discomfort, desires to be discharged home today             Tolerating po intake / no nausea / no vomiting / + flatus / + BM  Denies dizziness, SOB, or CP             Bleeding is moderate             Pain controlled withMotrin and Tylenol             Up ad lib / ambulatory/ voiding QS  Newborn breast feeding and pumping colostrum with formula supplementation due to baby's weight and blood sugar control  / Circumcision - planning today    O:  VS: BP 127/76 (BP Location: Left Arm)   Pulse 88   Temp 98.1 F (36.7 C) (Oral)   Resp 18   Ht  (1.676 m)   Wt 93 kg (205 lb)   SpO2 99%   Breastfeeding? Unknown   BMI 33.09 kg/m    LABS:               Recent Labs  07/25/17 0322 07/26/17 0546  WBC 14.9* 21.2*  HGB 13.1 10.0*  PLT 248 227               Bloodtype: --/--/O POS, O POS (10/12 1556)  Rubella: Immune (02/02 0000)                                             I&O: Intake/Output    None                Physical Exam:             Alert and Oriented X3  Lungs: Clear and unlabored  Heart: regular rate and rhythm / no murmurs  Abdomen: soft, non-tender, non-distended, active bowel sounds in all quadrants             Fundus: firm, non-tender, U-2             Dressing: honeycomb dsg c/d/i              Incision:  approximated with sutures / no erythema / no ecchymosis / no drainage  Perineum: intact  Lochia: appropriate, no clots  Extremities: +1 BLE edema, no calf pain or tenderness  A:        POD # 3, Primary LTCS for NRFHR, IOL for GHTN and Oligohydramnios            Gestational HTN - BPs stable, no s/s of PEC  Hypothyroidism - stable on Synthroid   Hx. Of Infertility   P:        Routine postoperative care              Discharge home today  [facility] discharge book and instructions given  Smart start nurse order placed for blood pressure check  in 1 week  F/u with Dr. Billy Coast in 6 weeks   Carlean Jews, MSN, CNM [facility]

## 2019-07-05 LAB — OB RESULTS CONSOLE ANTIBODY SCREEN: Antibody Screen: NEGATIVE

## 2019-07-05 LAB — OB RESULTS CONSOLE GC/CHLAMYDIA
Chlamydia: NEGATIVE
Gonorrhea: NEGATIVE

## 2019-07-05 LAB — OB RESULTS CONSOLE RUBELLA ANTIBODY, IGM: Rubella: IMMUNE

## 2019-07-05 LAB — OB RESULTS CONSOLE HIV ANTIBODY (ROUTINE TESTING): HIV: NONREACTIVE

## 2019-07-05 LAB — OB RESULTS CONSOLE RPR: RPR: NONREACTIVE

## 2019-07-05 LAB — OB RESULTS CONSOLE HEPATITIS B SURFACE ANTIGEN: Hepatitis B Surface Ag: NEGATIVE

## 2019-07-05 LAB — OB RESULTS CONSOLE ABO/RH: RH Type: POSITIVE

## 2019-07-14 ENCOUNTER — Encounter (HOSPITAL_COMMUNITY): Payer: Self-pay | Admitting: Obstetrics and Gynecology

## 2019-07-14 ENCOUNTER — Other Ambulatory Visit (HOSPITAL_COMMUNITY): Payer: Self-pay | Admitting: Obstetrics and Gynecology

## 2019-07-27 ENCOUNTER — Ambulatory Visit (HOSPITAL_COMMUNITY)
Admission: RE | Admit: 2019-07-27 | Discharge: 2019-07-27 | Disposition: A | Discharge status: Home / Self Care | Payer: PRIVATE HEALTH INSURANCE | Source: Ambulatory Visit | Attending: Obstetrics and Gynecology | Admitting: Obstetrics and Gynecology

## 2019-07-27 ENCOUNTER — Ambulatory Visit (HOSPITAL_COMMUNITY): Payer: PRIVATE HEALTH INSURANCE

## 2019-07-27 ENCOUNTER — Ambulatory Visit (HOSPITAL_BASED_OUTPATIENT_CLINIC_OR_DEPARTMENT_OTHER): Payer: PRIVATE HEALTH INSURANCE | Admitting: Genetic Counselor

## 2019-07-27 ENCOUNTER — Other Ambulatory Visit: Payer: Self-pay

## 2019-07-27 ENCOUNTER — Encounter (HOSPITAL_COMMUNITY): Payer: Self-pay

## 2019-07-27 ENCOUNTER — Ambulatory Visit (HOSPITAL_COMMUNITY): Payer: Self-pay | Admitting: Genetic Counselor

## 2019-07-27 ENCOUNTER — Other Ambulatory Visit (HOSPITAL_COMMUNITY): Payer: Self-pay | Admitting: Obstetrics and Gynecology

## 2019-07-27 ENCOUNTER — Ambulatory Visit (HOSPITAL_COMMUNITY): Payer: PRIVATE HEALTH INSURANCE | Admitting: *Deleted

## 2019-07-27 ENCOUNTER — Other Ambulatory Visit (HOSPITAL_COMMUNITY): Payer: Self-pay | Admitting: *Deleted

## 2019-07-27 VITALS — BP 118/78 | HR 107 | Temp 98.0°F

## 2019-07-27 DIAGNOSIS — Z3A12 12 weeks gestation of pregnancy: Secondary | ICD-10-CM

## 2019-07-27 DIAGNOSIS — O09529 Supervision of elderly multigravida, unspecified trimester: Secondary | ICD-10-CM | POA: Diagnosis present

## 2019-07-27 DIAGNOSIS — O358XX0 Maternal care for other (suspected) fetal abnormality and damage, not applicable or unspecified: Secondary | ICD-10-CM

## 2019-07-27 DIAGNOSIS — O280 Abnormal hematological finding on antenatal screening of mother: Secondary | ICD-10-CM | POA: Insufficient documentation

## 2019-07-27 DIAGNOSIS — O359XX0 Maternal care for (suspected) fetal abnormality and damage, unspecified, not applicable or unspecified: Secondary | ICD-10-CM

## 2019-07-27 DIAGNOSIS — O289 Unspecified abnormal findings on antenatal screening of mother: Secondary | ICD-10-CM

## 2019-07-27 DIAGNOSIS — O285 Abnormal chromosomal and genetic finding on antenatal screening of mother: Secondary | ICD-10-CM

## 2019-07-27 DIAGNOSIS — Z315 Encounter for genetic counseling: Secondary | ICD-10-CM

## 2019-07-27 NOTE — Consult Note (Signed)
Maternal-Fetal Medicine  Name: Donna Page MRN: [id number] Requesting Provider: Brien Few, MD  I had the pleasure of seeing [name] today at the [facility].  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  The risk for Down syndrome, trisomy 4 and trisomy 23 are not increased.  Monosomy X risk was reported as 1 in 2 (50%).  Obstetric history: She had a term cesarean delivery in 2018 of a female infant weighing 5 pounds 12 ounces at birth.  Her pregnancy was complicated by preeclampsia before delivery.  She reports having vaginal bleeding in early pregnancy that resolved.  In addition she had an early spontaneous miscarriage in February 2020.  No D&C was performed.  GYN history: No history of abnormal Pap smears or cervical surgeries. Past medical history: Hypothyroidism patient takes thyroid supplements.  She does not have chronic hypertension or diabetes or any other chronic medical conditions. Past surgical history: Cesarean section. Medications: Low-dose aspirin, prenatal vitamins, Synthroid 75 micrograms daily. Allergies: No known drug allergies. Social history: Denies tobacco drug or alcohol use.  She is been married 5 years and her husband is in good health.  On today's ultrasound, the CRL measurement is consistent with the previously established dates and good fetal heart activity seen.  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  In addition abdominal wall edema is seen fetal anatomy, otherwise, was limited because of early gestational age.  Placenta is posterior and there is no evidence of previa or accreta.  I counseled the patient on the following: Cystic hygroma and abnormal cell free fetal DNA screening: -Monosomy X seen on cell free fetal DNA should not be taken as confirmatory result of fetal karyotype.  At her age, the positive predictive value is about 60% (perinatal  quality.org).  -Presence of cystic hygroma on ultrasound increases the likelihood of chromosomally abnormal fetus.  -Explained cystic hygroma that results from abnormal vascular and lymphatic connections in the fetus. I counseled the patient that cystic hygroma can be associated with a) fetal chromosomal malformations including Down syndrome, monosomy X (Turner syndrome), trisomy 18 or 13 or other chromosomal anomalies, b) congenital heart defects and other major malformations, c) genetic syndromes and d) normal fetus.   -Cell-free fetal DNA has a higher detection rate for Down syndrome and trisomy 18 (risks of both were reported as not increased) than sex-chromosomal abnormalities.  -Informed her that only invasive testing including chorionic villus sampling (now) or amniocentesis (16 weeks) will provide a confirmatory result on the fetal karyotype.  -Although amniocentesis is recommended for confirmation of monosomy X seen on cell-free fetal DNA screening (likelihood of confined placental mosaicism), CVS is recommended because of abnormal ultrasound finding of cystic hygroma.  -Termination should not be decided on cell-free fetal DNA screening alone because of possibility false positive results.   -Samples obtained by invasive testing will be sent for fetal karyotype and we recommend microarray analysis to detect some (not all) genetic conditions if the chromosomes are normal.  -Monosomy X fetuses have a higher likelihood of spontaneous miscarriage.  -I also counseled the patient that there is a high-likelihood of intrauterine demise that can occur independent of the procedure (CVS).   -I briefly discussed postnatal outcomes of infants with monosomy X including the likelihood of cardiac anomalies, deafness, short stature and fertility issues. I did not discuss in detail.  After ultrasound, the patient met with our genetic counselor (webex).  She is  undecided about invasive testing. She will  return next week for ultrasound. She understands a high likelihood of fetal demise.  Recommendations: -Follow-up limited ultrasound next week and possible CVS. -If patient decides to continue her pregnancy, fetal anatomy scan at 18 weeks. -Fetal echocardiography at 18 to 20 weeks.

## 2019-07-27 NOTE — Progress Notes (Signed)
07/27/2019  Donna Page June 03, 1981 MRN: [id number] DOV: 07/27/2019  Donna Page presented to the [facility] for a genetics consultation regarding abnormalnoninvasive prenatal screening (NIPS) results that were high risk for monosomy X. Donna Page came to her appointment alone due to COVID-19 visitor restrictions.   Indication for genetic counseling - NIPS high risk for monosomy X  Prenatal history  Donna Page is a G40P1011, 38 y.o. female. Her current pregnancy has completed [redacted]w[redacted]d (Estimated Date of Delivery: 02/07/20).  Donna Page denied exposure to environmental toxins or chemical agents. She denied the use of alcohol, tobacco or street drugs. She reported taking progesterone, levothyroxine, prenatal vitamins, and baby aspirin. She denied significant viral illnesses, fevers, and bleeding during the course of her pregnancy. Her medical and surgical histories were noncontributory.  Family History  A three generation pedigree was drafted and reviewed. The family history is remarkable for the following:  - Donna Page reported a personal history of hypothyroidism. No one else in her family has a history of thyroid disorders. Many forms of thyroid disorders are multifactorial in nature, occurring due to a combination of genetic, lifestyle, and environmental factors. Thyroid disorders can appear to run in families; thus, there is a chance that the couple's children could also have a thyroid disorder.  - Donna Page's husband, Donna Page, was born with a leg length discrepancy that has required multiple surgeries. Fractures, trauma to a growth plate, infections, and genetic conditions can all cause limb length discrepancy in children. Without further information about the etiology of Donna Page's leg length discrepancy, precise risk assessment is limited.  - Donna Page has a maternal aunt who has a son with Down syndrome. We reviewed that Down syndrome is most often caused by  an entire extra copy of chromosome 21. This most commonly occurs due to errors in chromosomal division during the creation of egg and sperm cells in a process called nondisjunction. If Donna Page cousin has full trisomy 38 due to a nondisjunction event, there would not be an impact on the couple's personal risk to have a pregnancy affected by Down syndrome, as this process occurs sporadically. However, we reviewed that every woman has an age-related risk for having a pregnancy affected by a chromosomal condition such as Down syndrome. Donna Page had NIPS performed during this pregnancy to assess for the chance of the current pregnancy being affected by chromosomal aneuploidies, which came back low risk for Down syndrome but high risk for monosomy X. See Discussion section for more details.  The remaining family histories were reviewed and found to be noncontributory for birth defects, intellectual disability, recurrent pregnancy loss, and known genetic conditions.    The patient's ethnicity is [place] and [place]. The father of the pregnancy's ethnicity is [place]. Ashkenazi Jewish ancestry and consanguinity were denied. Pedigree will be scanned under Media.  Discussion  [name] referred for genetic counseling as results from noninvasive prenatal screening (NIPS)came back positive for an increased risk of monosomy X, aka Turner syndrome, in the current pregnancy. Fetuses with Turner syndrome may present prenatally with increased nuchal translucency, cystic hygroma, hydrops, cardiac defects, and renal anomalies on ultrasound. Additionally, there is up to a 99% risk for miscarriage in pregnancies affected with Turner syndrome. The risk of miscarriage is highest in the first trimester. Females born with Turner syndrome typically have short stature and early ovarian failure resulting in infertility. Affected individuals may also present with webbed necks, broad chests, cardiac defects, and renal  abnormalities.  Affected females typically have normal intelligence, but may have developmental delay, nonverbal learning disabilities, and behavioral problems. Symptoms vary widely among individuals.  Monosomy X typically results from the loss of one X chromosome in affected females. Thismost oftenoccursdue to arandom error in chromosomal divisionduring the formation of reproductive cells in a process called nondisjunction. Rarely, Turner syndrome may be caused by a partial deletion of an X chromosome, which can be inherited from a parent. Since Turner syndrome is most often not inherited, recurrence is rare; however, cases of recurrence have been reported. The risk for Turner syndrome does not increase with maternal age unlike some other chromosomal aneuploidies.  We reviewed that NIPS analyzes cell free DNA originating from the placenta that is found in the maternal blood circulation during pregnancy. This test can provide information regarding the presence or absence of extra fetal DNA for chromosomes 13, 18 and 21 as well as the sex chromosomes. The reported detection rate is greater than 99% for trisomy 21, greater than 99% for trisomy 18, greater than 91% for trisomy 13, and greater than 94% for monosomy X.However, it cannot be considered diagnostic for chromosome conditions. Positive predictive value (PPV) is the probability that a pregnancy with a positive test result is truly affected. The PPV reported by theNIPSlaboratoryfor monosomy X in the current pregnancywas estimated to be 50%. The PPV calculator offered by the [employer] and the [employer] estimated PPVfor monosomy X in the current pregnancyto be 42%. Thus, there is a 42-50% chance that this could be a true positive result.   Donna Page was counseled that there are several possible explanations for her high risk NIPS result. Firstly, the fetus could truly be affected by Turner syndrome. Secondly, the  fetus could be mosaic for Turner syndrome. Mosaicism occurs when an individual has two or more genetically different sets of cells in their body. Up to 50% of cases of Turner syndrome are mosaic. Individuals who are mosaic for Turner syndrome have some cells in the body with monosomy X and may have other cells that are chromosomally normal with two X chromosomes. We discussed that it would not be possible to tell which features an individual with mosaic Turner syndrome may have, as it is impossible to assess which specific cells and tissues in the body have Turner syndrome. However, mosaic Turner syndrome has been associated with better prognosis and mosaic fetuses generally have fewer ultrasound findings. Thirdly, the placenta could have Turner syndrome while the fetus could be unaffected. This is a phenomenon known as confined placental mosaicism (CPM). Fourthly, Donna Page may have an X chromosome abnormality herself that could explain this result. Somatic loss of a single X chromosome is a natural phenomenon that can occur as a woman ages. Additionally, women may be mosaic for Turner syndrome and not know it, as women with mosaicism can still be fertile. Lastly, this could be a false positive result that resulted due to normal variation.   Donna Page had a complete 12 week ultrasound performed prior to our appointment. Approximately 70% of fetuses with Turner syndrome will have at least one finding on ultrasound, and approximately 87% of fetuses with Turner syndrome will have an increased nuchal translucency in the first trimester. Donna Page's ultrasound identified a large cystic hygroma and abdominal wall edema. This increases the likelihood that Donna Page's high-risk NIPS result for Turner syndrome is a true positive, and that the fetus is affected by Turner syndrome. Donna Page inquired  about how often cardiac defects in particular are identified on prenatal ultrasounds in fetuses with Turner syndrome. We discussed  that 30-50% of individuals with Turner syndrome have a cardiac defect, and that these defects are often possible to identify on ultrasound during pregnancy. We also discussed the recommendation to undergo a fetal echocardiogram at 18-20 weeks' gestation for an even deeper assessment for fetal cardiac defects.  [name] also counseled regarding the option of diagnostic testing viachorionic villus sampling (CVS) oramniocentesis. We discussed the technical aspects of each procedure and quoted up to a 1 in 500 (0.2%) risk for spontaneous pregnancy loss or other adverse pregnancy outcomes as a result of either procedure. We reviewed that while there is a risk for confined placental mosaicism associated with Turner syndrome on CVS, given the ultrasound anomalies identified today, a positive result would likely be representative of the fetus as well as the placenta. While amniocentesis is often recommended for cases of monosomy X identified on NIPS, there is a chance the Donna Page could have a miscarriage prior to 16 weeks when amniocentesis becomes an available diagnostic testing option. Cultured cells from either a placental or amniotic fluid sample allow for the visualization of a fetal karyotype, which can detect >99% of chromosomal aberrationsincluding monosomy X. Chromosomal microarray can also be performed to identify smallerdeletions or duplications of fetal chromosomal material.DonnaBlomgren was informed that diagnostic testing would be the only way to definitivelydetermine if the fetus has Turner syndromeprenatally and aid in determining precise recurrence risks for future pregnancies. Diagnostic testing could also aid in determining the level of mosaicism that is present if the fetus is mosaic for Turner syndrome. Ms. Feeback was also made aware that she has the option of continuing with standard ultrasounds and pursuing genetic testing postnatally, with the caveat that there is a high likelihood of fetal  demise if the fetus truly has Turner syndrome. Testing on products of conception following a fetal loss is also possible.   Ms. Ploch had done thorough research on Turner syndrome and came prepared with excellent questions about her results, Turner syndrome, and her options moving forward in the pregnancy. She disclosed that her and her husband would likely not consider terminating the pregnancy even with a positive diagnosis of Turner syndrome. However, she also recognized the benefits in performing diagnostic testing either prenatally or postnatally to aid in determining recurrence risks. Ms. Rouse also inquired about survival rate of fetuses with Turner syndrome beyond the first trimester. We discussed that miscarriage typically happens in the first trimester; however, second trimester intrauterine fetal demises and stillbirths have been reported. Additionally, cystic hygromas can increase the risk of miscarriage. Ms. Douse inquired about how she would know if she was experiencing a miscarriage and wondered if she should monitor the fetal heartbeat using a Doppler ultrasound machine her sister has. We discussed that this is not recommended, and that she would be closely monitored via ultrasounds here in Maternal Fetal Medicine to assess the status of the fetus. We also discussed that she may experience some bleeding or cramping if she were having an miscarriage. I encouraged her to reach out to her doctor if she was ever experiencing any symptoms that were troubling to her.   At the end of our discussion, Ms. Sinkler was undecided on which, if any testing options she would like to pursue and requested some time to have discussions with her husband before making a decision. She was understandably emotional about all of the information presented. I validated that  she has received a lot of emotionally taxing information within the past 48 hours and is not expected to make any immediate decisions. Ms. Stanke indicated  that she had found a Facebook support group for individuals who received prenatal diagnoses of Turner syndrome that she had found very helpful. I encouraged her to continue utilizing that resource and consider inquiring about how other parents made decisions about the testing options that were best for their individual circumstances. Ms. Berkland will return for a follow-up ultrasound next week. At that time, we will revisit available testing options and determine if her and her husband have made any decisions on what they desire.   I counseled Ms. Greenlaw regarding the above risks and available options. The approximate face-to-face time with the genetic counselor was 40 minutes.  In summary:  Discussed NIPS results  NIPS high risk for monosomy X (Turner syndrome)  Reviewed results of ultrasound  Cystic hygroma and abdominal wall edema noted  Increased likelihood that the fetus has Turner syndrome  Offered additional testing and screening  Reviewed options of CVS, amniocentesis, testing on products of conception, postnatal testing  Undecided on which, if any, testing options she would like to pursue at this time  Will return for follow-up ultrasound next week. We will revisit available testing options at this appointment  Recommend fetal echocardiogram at 18-20 weeks' gestation  Reviewed family history concerns   Gershon Crane, MS Genetic Counselor

## 2019-08-04 ENCOUNTER — Ambulatory Visit (HOSPITAL_COMMUNITY)
Admission: RE | Admit: 2019-08-04 | Discharge: 2019-08-04 | Disposition: A | Discharge status: Home / Self Care | Payer: PRIVATE HEALTH INSURANCE | Source: Ambulatory Visit | Attending: Obstetrics and Gynecology | Admitting: Obstetrics and Gynecology

## 2019-08-04 ENCOUNTER — Other Ambulatory Visit: Payer: Self-pay

## 2019-08-04 ENCOUNTER — Other Ambulatory Visit (HOSPITAL_COMMUNITY): Payer: Self-pay | Admitting: Obstetrics and Gynecology

## 2019-08-04 ENCOUNTER — Ambulatory Visit (HOSPITAL_COMMUNITY): Payer: PRIVATE HEALTH INSURANCE | Admitting: *Deleted

## 2019-08-04 ENCOUNTER — Encounter (HOSPITAL_COMMUNITY): Payer: Self-pay

## 2019-08-04 ENCOUNTER — Other Ambulatory Visit (HOSPITAL_COMMUNITY): Payer: Self-pay | Admitting: *Deleted

## 2019-08-04 VITALS — BP 117/74 | HR 97 | Temp 98.7°F

## 2019-08-04 DIAGNOSIS — Z3A13 13 weeks gestation of pregnancy: Secondary | ICD-10-CM

## 2019-08-04 DIAGNOSIS — O289 Unspecified abnormal findings on antenatal screening of mother: Secondary | ICD-10-CM | POA: Diagnosis not present

## 2019-08-04 DIAGNOSIS — O359XX0 Maternal care for (suspected) fetal abnormality and damage, unspecified, not applicable or unspecified: Secondary | ICD-10-CM | POA: Diagnosis not present

## 2019-08-04 DIAGNOSIS — O358XX0 Maternal care for other (suspected) fetal abnormality and damage, not applicable or unspecified: Secondary | ICD-10-CM | POA: Insufficient documentation

## 2019-08-04 DIAGNOSIS — O09522 Supervision of elderly multigravida, second trimester: Secondary | ICD-10-CM

## 2019-09-19 ENCOUNTER — Ambulatory Visit (HOSPITAL_COMMUNITY): Payer: PRIVATE HEALTH INSURANCE

## 2019-09-19 ENCOUNTER — Other Ambulatory Visit (HOSPITAL_COMMUNITY): Payer: Self-pay | Admitting: *Deleted

## 2019-09-19 ENCOUNTER — Ambulatory Visit (HOSPITAL_COMMUNITY)
Admission: RE | Admit: 2019-09-19 | Discharge: 2019-09-19 | Disposition: A | Discharge status: Home / Self Care | Payer: PRIVATE HEALTH INSURANCE | Source: Ambulatory Visit | Attending: Obstetrics and Gynecology | Admitting: Obstetrics and Gynecology

## 2019-09-19 ENCOUNTER — Ambulatory Visit (HOSPITAL_BASED_OUTPATIENT_CLINIC_OR_DEPARTMENT_OTHER): Payer: PRIVATE HEALTH INSURANCE | Admitting: *Deleted

## 2019-09-19 ENCOUNTER — Encounter (HOSPITAL_COMMUNITY): Payer: Self-pay

## 2019-09-19 ENCOUNTER — Other Ambulatory Visit: Payer: Self-pay

## 2019-09-19 VITALS — BP 117/69 | HR 88 | Temp 97.3°F

## 2019-09-19 DIAGNOSIS — O09522 Supervision of elderly multigravida, second trimester: Secondary | ICD-10-CM

## 2019-09-19 DIAGNOSIS — O359XX0 Maternal care for (suspected) fetal abnormality and damage, unspecified, not applicable or unspecified: Secondary | ICD-10-CM | POA: Diagnosis not present

## 2019-09-19 DIAGNOSIS — O358XX0 Maternal care for other (suspected) fetal abnormality and damage, not applicable or unspecified: Secondary | ICD-10-CM | POA: Diagnosis present

## 2019-09-19 DIAGNOSIS — O280 Abnormal hematological finding on antenatal screening of mother: Secondary | ICD-10-CM

## 2019-09-19 DIAGNOSIS — Z3A19 19 weeks gestation of pregnancy: Secondary | ICD-10-CM | POA: Insufficient documentation

## 2019-09-19 DIAGNOSIS — O289 Unspecified abnormal findings on antenatal screening of mother: Secondary | ICD-10-CM

## 2019-09-19 NOTE — Progress Notes (Signed)
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  The patient had a cell free DNA test earlier in her pregnancy which indicated an increased risk for Turner syndrome (85 X0).  She denies any significant past medical history and denies any problems in her current pregnancy.    She was informed that the fetal growth and amniotic fluid level were appropriate for her gestational age.   There were no obvious fetal anomalies noted on today's ultrasound exam.  The cystic hygroma appears to be resolving.  The remnants of the effects of the cystic hygroma continues to be noted on today's exam.  An enlarged nuchal fold and also edema in the fetal face and lower extremities continues to be noted.  There were no signs of abdominal or thoracic ascites noted today.  The patient was informed that anomalies may be missed due to technical limitations. If the fetus is in a suboptimal position or maternal habitus is increased, visualization of the fetus in the maternal uterus may be impaired.  The patient was advised that as her cell free DNA test indicated an increased risk of Turner syndrome along with the cystic hygroma noted earlier in her pregnancy, there is a very high likelihood that her baby has Turner syndrome.  She was once again offered and declined an amniocentesis for definitive diagnosis of fetal aneuploidy today.  The patient stated that a fetus with Turner syndrome would not change the management of her current pregnancy.  The patient was reassured that girls who are born with Turner syndrome will usually live a normal life.  These girls will most likely require growth hormones at the time of puberty.  Women with Turner syndrome usually have nonfunctioning ovaries however, they do have a normal uterus and may be able to carry a pregnancy that is conceived through in vitro fertilization using a  donor egg.  Although the cystic hygroma appears to be resolving, the patient was advised regarding the increased risk of an IUFD in fetuses with a cystic hygroma.  She was advised to continue to monitor fetal kick counts.  Due to the previously noted cystic hygroma and the association of congenital heart defects in fetuses with a cystic hygroma, she was referred to [facility] pediatric cardiology for a fetal echocardiogram.  The patient was advised that we will continue to follow her closely with you throughout her pregnancy.  A follow-up exam was scheduled in 4 weeks.  A total of 15 minutes was spent counseling and coordinating the care for this patient.  Greater than 50% of the time was spent in direct face-to-face contact.

## 2019-10-03 ENCOUNTER — Encounter (HOSPITAL_COMMUNITY): Payer: Self-pay | Admitting: Obstetrics and Gynecology

## 2019-10-18 ENCOUNTER — Ambulatory Visit (HOSPITAL_COMMUNITY): Payer: PRIVATE HEALTH INSURANCE

## 2019-11-03 ENCOUNTER — Ambulatory Visit (HOSPITAL_COMMUNITY)
Admission: RE | Admit: 2019-11-03 | Discharge: 2019-11-03 | Disposition: A | Discharge status: Home / Self Care | Payer: PRIVATE HEALTH INSURANCE | Source: Ambulatory Visit | Attending: Obstetrics | Admitting: Obstetrics

## 2019-11-03 ENCOUNTER — Ambulatory Visit (HOSPITAL_COMMUNITY): Payer: PRIVATE HEALTH INSURANCE | Admitting: *Deleted

## 2019-11-03 ENCOUNTER — Other Ambulatory Visit (HOSPITAL_COMMUNITY): Payer: Self-pay | Admitting: *Deleted

## 2019-11-03 ENCOUNTER — Encounter (HOSPITAL_COMMUNITY): Payer: Self-pay

## 2019-11-03 ENCOUNTER — Other Ambulatory Visit: Payer: Self-pay

## 2019-11-03 VITALS — BP 113/76 | HR 92 | Temp 97.4°F

## 2019-11-03 DIAGNOSIS — O0990 Supervision of high risk pregnancy, unspecified, unspecified trimester: Secondary | ICD-10-CM | POA: Insufficient documentation

## 2019-11-03 DIAGNOSIS — O289 Unspecified abnormal findings on antenatal screening of mother: Secondary | ICD-10-CM

## 2019-11-03 DIAGNOSIS — O359XX0 Maternal care for (suspected) fetal abnormality and damage, unspecified, not applicable or unspecified: Secondary | ICD-10-CM

## 2019-11-03 DIAGNOSIS — O280 Abnormal hematological finding on antenatal screening of mother: Secondary | ICD-10-CM

## 2019-11-03 DIAGNOSIS — Z3A26 26 weeks gestation of pregnancy: Secondary | ICD-10-CM

## 2019-11-03 DIAGNOSIS — O09522 Supervision of elderly multigravida, second trimester: Secondary | ICD-10-CM

## 2019-11-03 DIAGNOSIS — O285 Abnormal chromosomal and genetic finding on antenatal screening of mother: Secondary | ICD-10-CM

## 2019-11-30 ENCOUNTER — Ambulatory Visit: Payer: PRIVATE HEALTH INSURANCE

## 2019-12-01 ENCOUNTER — Encounter (HOSPITAL_COMMUNITY): Payer: Self-pay

## 2019-12-01 ENCOUNTER — Ambulatory Visit (HOSPITAL_COMMUNITY): Payer: PRIVATE HEALTH INSURANCE

## 2020-01-17 ENCOUNTER — Encounter (HOSPITAL_COMMUNITY): Payer: Self-pay

## 2020-01-18 ENCOUNTER — Other Ambulatory Visit: Payer: Self-pay | Admitting: Obstetrics and Gynecology

## 2020-01-22 ENCOUNTER — Inpatient Hospital Stay (HOSPITAL_COMMUNITY)
Admission: AD | Admit: 2020-01-22 | Discharge: 2020-01-23 | DRG: 788 | Disposition: A | Discharge status: Home / Self Care | Payer: Commercial Managed Care - PPO | Attending: Obstetrics and Gynecology | Admitting: Obstetrics and Gynecology

## 2020-01-22 ENCOUNTER — Inpatient Hospital Stay (HOSPITAL_COMMUNITY): Payer: Commercial Managed Care - PPO | Admitting: Anesthesiology

## 2020-01-22 ENCOUNTER — Encounter (HOSPITAL_COMMUNITY): Payer: Self-pay | Admitting: Obstetrics and Gynecology

## 2020-01-22 ENCOUNTER — Other Ambulatory Visit: Payer: Self-pay

## 2020-01-22 ENCOUNTER — Encounter (HOSPITAL_COMMUNITY)
Admission: AD | Disposition: A | Discharge status: Home / Self Care | Payer: Self-pay | Source: Home / Self Care | Attending: Obstetrics and Gynecology

## 2020-01-22 DIAGNOSIS — Z98891 History of uterine scar from previous surgery: Secondary | ICD-10-CM

## 2020-01-22 DIAGNOSIS — O34211 Maternal care for low transverse scar from previous cesarean delivery: Principal | ICD-10-CM | POA: Diagnosis present

## 2020-01-22 DIAGNOSIS — O99284 Endocrine, nutritional and metabolic diseases complicating childbirth: Secondary | ICD-10-CM | POA: Diagnosis present

## 2020-01-22 DIAGNOSIS — O34219 Maternal care for unspecified type scar from previous cesarean delivery: Secondary | ICD-10-CM | POA: Diagnosis present

## 2020-01-22 DIAGNOSIS — O24420 Gestational diabetes mellitus in childbirth, diet controlled: Secondary | ICD-10-CM | POA: Diagnosis present

## 2020-01-22 DIAGNOSIS — Z20822 Contact with and (suspected) exposure to covid-19: Secondary | ICD-10-CM | POA: Diagnosis present

## 2020-01-22 DIAGNOSIS — E039 Hypothyroidism, unspecified: Secondary | ICD-10-CM | POA: Diagnosis present

## 2020-01-22 DIAGNOSIS — O358XX0 Maternal care for other (suspected) fetal abnormality and damage, not applicable or unspecified: Secondary | ICD-10-CM | POA: Diagnosis present

## 2020-01-22 DIAGNOSIS — O359XX0 Maternal care for (suspected) fetal abnormality and damage, unspecified, not applicable or unspecified: Secondary | ICD-10-CM | POA: Diagnosis present

## 2020-01-22 DIAGNOSIS — Z3A37 37 weeks gestation of pregnancy: Secondary | ICD-10-CM

## 2020-01-22 HISTORY — DX: Gestational diabetes mellitus in pregnancy, unspecified control: O24.419

## 2020-01-22 LAB — RESPIRATORY PANEL BY RT PCR (FLU A&B, COVID)
Influenza A by PCR: NEGATIVE
Influenza B by PCR: NEGATIVE
SARS Coronavirus 2 by RT PCR: NEGATIVE

## 2020-01-22 LAB — CBC
HCT: 41.3 % (ref 36.0–46.0)
Hemoglobin: 13.7 g/dL (ref 12.0–15.0)
MCH: 29.8 pg (ref 26.0–34.0)
MCHC: 33.2 g/dL (ref 30.0–36.0)
MCV: 90 fL (ref 80.0–100.0)
Platelets: 218 10*3/uL (ref 150–400)
RBC: 4.59 MIL/uL (ref 3.87–5.11)
RDW: 13 % (ref 11.5–15.5)
WBC: 9.1 10*3/uL (ref 4.0–10.5)
nRBC: 0 % (ref 0.0–0.2)

## 2020-01-22 LAB — ABO/RH: ABO/RH(D): O POS

## 2020-01-22 LAB — POCT FERN TEST: POCT Fern Test: POSITIVE

## 2020-01-22 LAB — TYPE AND SCREEN
ABO/RH(D): O POS
Antibody Screen: NEGATIVE

## 2020-01-22 LAB — RPR: RPR Ser Ql: NONREACTIVE

## 2020-01-22 LAB — GLUCOSE, CAPILLARY: Glucose-Capillary: 91 mg/dL (ref 70–99)

## 2020-01-22 SURGERY — Surgical Case
Anesthesia: Spinal | Wound class: Clean Contaminated

## 2020-01-22 MED ORDER — NALOXONE HCL 4 MG/10ML IJ SOLN
1.0000 ug/kg/h | INTRAVENOUS | Status: DC | PRN
  Filled 2020-01-22: qty 5

## 2020-01-22 MED ORDER — MEPERIDINE HCL 25 MG/ML IJ SOLN: 6.2500 mg | INTRAMUSCULAR | Status: DC | PRN

## 2020-01-22 MED ORDER — LACTATED RINGERS IV SOLN: INTRAVENOUS | Status: DC

## 2020-01-22 MED ORDER — PHENYLEPHRINE HCL-NACL 20-0.9 MG/250ML-% IV SOLN
INTRAVENOUS | Status: AC
  Filled 2020-01-22: qty 250

## 2020-01-22 MED ORDER — IBUPROFEN 800 MG PO TABS
800.0000 mg | ORAL_TABLET | Freq: Four times a day (QID) | ORAL | Status: DC
  Administered 2020-01-23 (×2): 800 mg via ORAL
  Filled 2020-01-22 (×2): qty 1

## 2020-01-22 MED ORDER — DEXAMETHASONE SODIUM PHOSPHATE 4 MG/ML IJ SOLN
INTRAMUSCULAR | Status: AC
  Filled 2020-01-22: qty 1

## 2020-01-22 MED ORDER — PHENYLEPHRINE HCL-NACL 20-0.9 MG/250ML-% IV SOLN
INTRAVENOUS | Status: DC | PRN
  Administered 2020-01-22: 60 ug/min via INTRAVENOUS

## 2020-01-22 MED ORDER — ONDANSETRON HCL 4 MG/2ML IJ SOLN
INTRAMUSCULAR | Status: DC | PRN
  Administered 2020-01-22: 4 mg via INTRAVENOUS

## 2020-01-22 MED ORDER — HYDROMORPHONE HCL 1 MG/ML IJ SOLN: 0.2500 mg | INTRAMUSCULAR | Status: DC | PRN

## 2020-01-22 MED ORDER — PHENYLEPHRINE 40 MCG/ML (10ML) SYRINGE FOR IV PUSH (FOR BLOOD PRESSURE SUPPORT)
PREFILLED_SYRINGE | INTRAVENOUS | Status: AC
  Filled 2020-01-22: qty 10

## 2020-01-22 MED ORDER — TETANUS-DIPHTH-ACELL PERTUSSIS 5-2.5-18.5 LF-MCG/0.5 IM SUSP: 0.5000 mL | Freq: Once | INTRAMUSCULAR | Status: DC

## 2020-01-22 MED ORDER — DIPHENHYDRAMINE HCL 25 MG PO CAPS: 25.0000 mg | ORAL_CAPSULE | ORAL | Status: DC | PRN

## 2020-01-22 MED ORDER — ACETAMINOPHEN 500 MG PO TABS
1000.0000 mg | ORAL_TABLET | Freq: Four times a day (QID) | ORAL | Status: AC
  Administered 2020-01-22 (×3): 1000 mg via ORAL
  Filled 2020-01-22 (×3): qty 2

## 2020-01-22 MED ORDER — BUPIVACAINE HCL (PF) 0.25 % IJ SOLN
INTRAMUSCULAR | Status: DC | PRN
  Administered 2020-01-22: 20 mL

## 2020-01-22 MED ORDER — SCOPOLAMINE 1 MG/3DAYS TD PT72
MEDICATED_PATCH | TRANSDERMAL | Status: DC | PRN
  Administered 2020-01-22: 1 via TRANSDERMAL

## 2020-01-22 MED ORDER — FENTANYL CITRATE (PF) 100 MCG/2ML IJ SOLN
INTRAMUSCULAR | Status: DC | PRN
  Administered 2020-01-22: 15 ug via INTRATHECAL

## 2020-01-22 MED ORDER — MENTHOL 3 MG MT LOZG: 1.0000 | LOZENGE | OROMUCOSAL | Status: DC | PRN

## 2020-01-22 MED ORDER — DIPHENHYDRAMINE HCL 50 MG/ML IJ SOLN
INTRAMUSCULAR | Status: AC
  Filled 2020-01-22: qty 1

## 2020-01-22 MED ORDER — BUPIVACAINE IN DEXTROSE 0.75-8.25 % IT SOLN
INTRATHECAL | Status: DC | PRN
  Administered 2020-01-22: 1.6 mL via INTRATHECAL

## 2020-01-22 MED ORDER — ONDANSETRON HCL 4 MG/2ML IJ SOLN
INTRAMUSCULAR | Status: AC
  Filled 2020-01-22: qty 2

## 2020-01-22 MED ORDER — SIMETHICONE 80 MG PO CHEW
80.0000 mg | CHEWABLE_TABLET | ORAL | Status: DC
  Administered 2020-01-22: 80 mg via ORAL
  Filled 2020-01-22: qty 1

## 2020-01-22 MED ORDER — METHYLERGONOVINE MALEATE 0.2 MG PO TABS: 0.2000 mg | ORAL_TABLET | ORAL | Status: DC | PRN

## 2020-01-22 MED ORDER — KETOROLAC TROMETHAMINE 30 MG/ML IJ SOLN
INTRAMUSCULAR | Status: AC
  Filled 2020-01-22: qty 1

## 2020-01-22 MED ORDER — ZOLPIDEM TARTRATE 5 MG PO TABS: 5.0000 mg | ORAL_TABLET | Freq: Every evening | ORAL | Status: DC | PRN

## 2020-01-22 MED ORDER — METOCLOPRAMIDE HCL 5 MG/ML IJ SOLN
INTRAMUSCULAR | Status: AC
  Filled 2020-01-22: qty 2

## 2020-01-22 MED ORDER — SODIUM CHLORIDE 0.9 % IV SOLN: INTRAVENOUS | Status: DC | PRN

## 2020-01-22 MED ORDER — NALOXONE HCL 0.4 MG/ML IJ SOLN: 0.4000 mg | INTRAMUSCULAR | Status: DC | PRN

## 2020-01-22 MED ORDER — SOD CITRATE-CITRIC ACID 500-334 MG/5ML PO SOLN
30.0000 mL | Freq: Once | ORAL | Status: AC
  Administered 2020-01-22: 30 mL via ORAL
  Filled 2020-01-22: qty 30

## 2020-01-22 MED ORDER — OXYTOCIN 10 UNIT/ML IJ SOLN
INTRAMUSCULAR | Status: DC | PRN
  Administered 2020-01-22: 40 [IU] via INTRAMUSCULAR

## 2020-01-22 MED ORDER — FAMOTIDINE IN NACL 20-0.9 MG/50ML-% IV SOLN
20.0000 mg | Freq: Once | INTRAVENOUS | Status: AC
  Administered 2020-01-22: 20 mg via INTRAVENOUS
  Filled 2020-01-22: qty 50

## 2020-01-22 MED ORDER — NALBUPHINE HCL 10 MG/ML IJ SOLN: 5.0000 mg | Freq: Once | INTRAMUSCULAR | Status: DC | PRN

## 2020-01-22 MED ORDER — SIMETHICONE 80 MG PO CHEW: 80.0000 mg | CHEWABLE_TABLET | ORAL | Status: DC | PRN

## 2020-01-22 MED ORDER — PRENATAL MULTIVITAMIN CH
1.0000 | ORAL_TABLET | Freq: Every day | ORAL | Status: DC
  Administered 2020-01-22 – 2020-01-23 (×2): 1 via ORAL
  Filled 2020-01-22 (×2): qty 1

## 2020-01-22 MED ORDER — KETOROLAC TROMETHAMINE 30 MG/ML IJ SOLN
30.0000 mg | Freq: Four times a day (QID) | INTRAMUSCULAR | Status: AC
  Administered 2020-01-22 (×3): 30 mg via INTRAVENOUS
  Filled 2020-01-22 (×3): qty 1

## 2020-01-22 MED ORDER — SENNOSIDES-DOCUSATE SODIUM 8.6-50 MG PO TABS
2.0000 | ORAL_TABLET | ORAL | Status: DC
  Administered 2020-01-22: 2 via ORAL
  Filled 2020-01-22: qty 2

## 2020-01-22 MED ORDER — MORPHINE SULFATE (PF) 0.5 MG/ML IJ SOLN
INTRAMUSCULAR | Status: AC
  Filled 2020-01-22: qty 10

## 2020-01-22 MED ORDER — DEXAMETHASONE SODIUM PHOSPHATE 4 MG/ML IJ SOLN
INTRAMUSCULAR | Status: DC | PRN
  Administered 2020-01-22: 4 mg via INTRAVENOUS

## 2020-01-22 MED ORDER — DIPHENHYDRAMINE HCL 25 MG PO CAPS: 25.0000 mg | ORAL_CAPSULE | Freq: Four times a day (QID) | ORAL | Status: DC | PRN

## 2020-01-22 MED ORDER — OXYTOCIN 40 UNITS IN NORMAL SALINE INFUSION - SIMPLE MED
INTRAVENOUS | Status: AC
  Filled 2020-01-22: qty 1000

## 2020-01-22 MED ORDER — DIBUCAINE (PERIANAL) 1 % EX OINT: 1.0000 "application " | TOPICAL_OINTMENT | CUTANEOUS | Status: DC | PRN

## 2020-01-22 MED ORDER — BUPIVACAINE HCL (PF) 0.25 % IJ SOLN
INTRAMUSCULAR | Status: AC
  Filled 2020-01-22: qty 20

## 2020-01-22 MED ORDER — OXYTOCIN 40 UNITS IN NORMAL SALINE INFUSION - SIMPLE MED: 2.5000 [IU]/h | INTRAVENOUS | Status: AC

## 2020-01-22 MED ORDER — CEFAZOLIN SODIUM-DEXTROSE 2-4 GM/100ML-% IV SOLN
2.0000 g | INTRAVENOUS | Status: AC
  Administered 2020-01-22: 2 g via INTRAVENOUS
  Filled 2020-01-22: qty 100

## 2020-01-22 MED ORDER — COCONUT OIL OIL: 1.0000 "application " | TOPICAL_OIL | Status: DC | PRN

## 2020-01-22 MED ORDER — NALBUPHINE HCL 10 MG/ML IJ SOLN: 5.0000 mg | INTRAMUSCULAR | Status: DC | PRN

## 2020-01-22 MED ORDER — FENTANYL CITRATE (PF) 100 MCG/2ML IJ SOLN
INTRAMUSCULAR | Status: AC
  Filled 2020-01-22: qty 2

## 2020-01-22 MED ORDER — PROMETHAZINE HCL 25 MG/ML IJ SOLN: 6.2500 mg | INTRAMUSCULAR | Status: DC | PRN

## 2020-01-22 MED ORDER — MEPERIDINE HCL 25 MG/ML IJ SOLN
INTRAMUSCULAR | Status: DC | PRN
  Administered 2020-01-22 (×2): 12.5 mg via INTRAVENOUS

## 2020-01-22 MED ORDER — KETOROLAC TROMETHAMINE 30 MG/ML IJ SOLN
30.0000 mg | Freq: Four times a day (QID) | INTRAMUSCULAR | Status: AC | PRN
  Administered 2020-01-22: 07:00:00 30 mg via INTRAMUSCULAR

## 2020-01-22 MED ORDER — SODIUM CHLORIDE 0.9% FLUSH: 3.0000 mL | INTRAVENOUS | Status: DC | PRN

## 2020-01-22 MED ORDER — ONDANSETRON HCL 4 MG/2ML IJ SOLN: 4.0000 mg | Freq: Three times a day (TID) | INTRAMUSCULAR | Status: DC | PRN

## 2020-01-22 MED ORDER — OXYCODONE-ACETAMINOPHEN 5-325 MG PO TABS: 1.0000 | ORAL_TABLET | ORAL | Status: DC | PRN

## 2020-01-22 MED ORDER — LACTATED RINGERS IV SOLN: INTRAVENOUS | Status: DC | PRN

## 2020-01-22 MED ORDER — WITCH HAZEL-GLYCERIN EX PADS: 1.0000 "application " | MEDICATED_PAD | CUTANEOUS | Status: DC | PRN

## 2020-01-22 MED ORDER — PHENYLEPHRINE HCL (PRESSORS) 10 MG/ML IV SOLN
INTRAVENOUS | Status: DC | PRN
  Administered 2020-01-22 (×2): 80 ug via INTRAVENOUS

## 2020-01-22 MED ORDER — DIPHENHYDRAMINE HCL 50 MG/ML IJ SOLN: 12.5000 mg | INTRAMUSCULAR | Status: DC | PRN

## 2020-01-22 MED ORDER — METOCLOPRAMIDE HCL 5 MG/ML IJ SOLN
INTRAMUSCULAR | Status: DC | PRN
  Administered 2020-01-22: 10 mg via INTRAVENOUS

## 2020-01-22 MED ORDER — LEVOTHYROXINE SODIUM 75 MCG PO TABS
75.0000 ug | ORAL_TABLET | Freq: Every day | ORAL | Status: DC
  Filled 2020-01-22: qty 1

## 2020-01-22 MED ORDER — MORPHINE SULFATE (PF) 0.5 MG/ML IJ SOLN
INTRAMUSCULAR | Status: DC | PRN
  Administered 2020-01-22: .15 mg via INTRATHECAL

## 2020-01-22 MED ORDER — KETOROLAC TROMETHAMINE 30 MG/ML IJ SOLN: 30.0000 mg | Freq: Four times a day (QID) | INTRAMUSCULAR | Status: AC | PRN

## 2020-01-22 MED ORDER — SODIUM CHLORIDE (PF) 0.9 % IJ SOLN
INTRAMUSCULAR | Status: AC
  Filled 2020-01-22: qty 10

## 2020-01-22 MED ORDER — KETOROLAC TROMETHAMINE 30 MG/ML IJ SOLN: 30.0000 mg | Freq: Once | INTRAMUSCULAR | Status: DC | PRN

## 2020-01-22 MED ORDER — SCOPOLAMINE 1 MG/3DAYS TD PT72: 1.0000 | MEDICATED_PATCH | Freq: Once | TRANSDERMAL | Status: DC

## 2020-01-22 MED ORDER — MEPERIDINE HCL 25 MG/ML IJ SOLN
INTRAMUSCULAR | Status: AC
  Filled 2020-01-22: qty 1

## 2020-01-22 MED ORDER — SIMETHICONE 80 MG PO CHEW
80.0000 mg | CHEWABLE_TABLET | Freq: Three times a day (TID) | ORAL | Status: DC
  Administered 2020-01-22 – 2020-01-23 (×4): 80 mg via ORAL
  Filled 2020-01-22 (×4): qty 1

## 2020-01-22 MED ORDER — METHYLERGONOVINE MALEATE 0.2 MG/ML IJ SOLN: 0.2000 mg | INTRAMUSCULAR | Status: DC | PRN

## 2020-01-22 MED ORDER — SODIUM CHLORIDE 0.9 % IR SOLN
Status: DC | PRN
  Administered 2020-01-22: 1000 mL

## 2020-01-22 SURGICAL SUPPLY — 33 items
CHLORAPREP W/TINT 26ML (MISCELLANEOUS) ×3 IMPLANT
CLAMP CORD UMBIL (MISCELLANEOUS) IMPLANT
CLOTH BEACON ORANGE TIMEOUT ST (SAFETY) ×3 IMPLANT
DRSG OPSITE POSTOP 4X10 (GAUZE/BANDAGES/DRESSINGS) ×3 IMPLANT
ELECT REM PT RETURN 9FT ADLT (ELECTROSURGICAL) ×3
ELECTRODE REM PT RTRN 9FT ADLT (ELECTROSURGICAL) ×1 IMPLANT
EXTRACTOR VACUUM M CUP 4 TUBE (SUCTIONS) IMPLANT
EXTRACTOR VACUUM M CUP 4' TUBE (SUCTIONS)
GLOVE BIO SURGEON STRL SZ7.5 (GLOVE) ×3 IMPLANT
GLOVE BIOGEL PI IND STRL 7.0 (GLOVE) ×1 IMPLANT
GLOVE BIOGEL PI INDICATOR 7.0 (GLOVE) ×2
GOWN STRL REUS W/TWL LRG LVL3 (GOWN DISPOSABLE) ×6 IMPLANT
KIT ABG SYR 3ML LUER SLIP (SYRINGE) IMPLANT
NEEDLE HYPO 22GX1.5 SAFETY (NEEDLE) ×3 IMPLANT
NEEDLE HYPO 25X5/8 SAFETYGLIDE (NEEDLE) IMPLANT
NEEDLE SPNL 20GX3.5 QUINCKE YW (NEEDLE) IMPLANT
NS IRRIG 1000ML POUR BTL (IV SOLUTION) ×3 IMPLANT
PACK C SECTION WH (CUSTOM PROCEDURE TRAY) ×3 IMPLANT
PENCIL SMOKE EVAC W/HOLSTER (ELECTROSURGICAL) ×3 IMPLANT
SUT MNCRL 0 VIOLET CTX 36 (SUTURE) ×4 IMPLANT
SUT MNCRL AB 3-0 PS2 27 (SUTURE) IMPLANT
SUT MON AB 2-0 CT1 27 (SUTURE) ×3 IMPLANT
SUT MON AB-0 CT1 36 (SUTURE) ×6 IMPLANT
SUT MONOCRYL 0 CTX 36 (SUTURE) ×8
SUT PLAIN 0 NONE (SUTURE) IMPLANT
SUT PLAIN 2 0 (SUTURE)
SUT PLAIN 2 0 XLH (SUTURE) ×3 IMPLANT
SUT PLAIN ABS 2-0 CT1 27XMFL (SUTURE) IMPLANT
SYR 20CC LL (SYRINGE) IMPLANT
SYR CONTROL 10ML LL (SYRINGE) ×3 IMPLANT
TOWEL OR 17X24 6PK STRL BLUE (TOWEL DISPOSABLE) ×3 IMPLANT
TRAY FOLEY W/BAG SLVR 14FR LF (SET/KITS/TRAYS/PACK) ×3 IMPLANT
WATER STERILE IRR 1000ML POUR (IV SOLUTION) ×3 IMPLANT

## 2020-01-22 NOTE — Op Note (Signed)
Cesarean Section Procedure Note  Indications: patient declines vag del attempt and previous uterine incision kerr x one  Pre-operative Diagnosis: 37 week 3 day pregnancy.  Post-operative Diagnosis: same  Surgeon: Lenoard Aden   Assistants: Renae Fickle, CNM  Anesthesia: Local anesthesia 0.25.% bupivacaine and Spinal anesthesia  ASA Class: 2  Procedure Details  The patient was seen in the Holding Room. The risks, benefits, complications, treatment options, and expected outcomes were discussed with the patient.  The patient concurred with the proposed plan, giving informed consent. The risks of anesthesia, infection, bleeding and possible injury to other organs discussed. Injury to bowel, bladder, or ureter with possible need for repair discussed. Possible need for transfusion with secondary risks of hepatitis or HIV acquisition discussed. Post operative complications to include but not limited to DVT, PE and Pneumonia noted. The site of surgery properly noted/marked. The patient was taken to Operating Room # C, identified as Donna Page and the procedure verified as C-Section Delivery. A Time Out was held and the above information confirmed.  After induction of anesthesia, the patient was draped and prepped in the usual sterile manner. A Pfannenstiel incision was made and carried down through the subcutaneous tissue to the fascia. Fascial incision was made and extended transversely using Mayo scissors. The fascia was separated from the underlying rectus tissue superiorly and inferiorly. The peritoneum was identified and entered. Peritoneal incision was extended longitudinally. The utero-vesical peritoneal reflection was incised transversely and the bladder flap was bluntly freed from the lower uterine segment. A low transverse uterine incision(Kerr hysterotomy) was made. Delivered from OA presentation was a  female with Apgar scores of 9 at one minute and 9 at five minutes. Bulb suctioning gently  performed. Neonatal team in attendance.After the umbilical cord was clamped and cut cord blood was obtained for evaluation. The placenta was removed intact and appeared normal. The uterus was curetted with a dry lap pack. Good hemostasis was noted.The uterine outline, tubes and ovaries appeared normal. The uterine incision was closed with running locked sutures of 0 Monocryl x 2 layers after uterus exteriorized for visualization. Interrupted sutures in midline for hemostasis. Hemostasis was observed. Lavage was carried out until clear.The parietal peritoneum was closed with a running 2-0 Monocryl suture. The fascia was then reapproximated with running sutures of 0 Monocryl. The skin was reapproximated with 3-0 monocryl after [place] closure with 2-0 plain..  Instrument, sponge, and needle counts were correct prior the abdominal closure and at the conclusion of the case.   Findings: FTLF, Apgars as noted. Anterior placenta  Estimated Blood Loss:   500         Drains: foley                 Specimens: placenta                 Complications:  None; patient tolerated the procedure well.         Disposition: PACU - hemodynamically stable.         Condition: stable  Attending Attestation: I performed the procedure.

## 2020-01-22 NOTE — Anesthesia Preprocedure Evaluation (Signed)
Anesthesia Evaluation  Patient identified by MRN, date of birth, ID band Patient awake    Reviewed: Allergy & Precautions, H&P , NPO status , Patient's Chart, lab work & pertinent test results  Airway Mallampati: I  TM Distance: >3 FB Neck ROM: full    Dental no notable dental hx. (+) Teeth Intact   Pulmonary    Pulmonary exam normal breath sounds clear to auscultation       Cardiovascular Normal cardiovascular exam Rhythm:regular Rate:Normal     Neuro/Psych negative neurological ROS  negative psych ROS   GI/Hepatic negative GI ROS, Neg liver ROS,   Endo/Other  diabetesHypothyroidism   Renal/GU negative Renal ROS     Musculoskeletal   Abdominal Normal abdominal exam  (+)   Peds  Hematology negative hematology ROS (+)   Anesthesia Other Findings   Reproductive/Obstetrics (+) Pregnancy                             Anesthesia Physical Anesthesia Plan  ASA: II  Anesthesia Plan: Spinal   Post-op Pain Management:    Induction:   PONV Risk Score and Plan: 3 and Ondansetron, Dexamethasone and Scopolamine patch - Pre-op  Airway Management Planned: Nasal Cannula and Natural Airway  Additional Equipment: None  Intra-op Plan:   Post-operative Plan:   Informed Consent: I have reviewed the patients History and Physical, chart, labs and discussed the procedure including the risks, benefits and alternatives for the proposed anesthesia with the patient or authorized representative who has indicated his/her understanding and acceptance.       Plan Discussed with: CRNA  Anesthesia Plan Comments:         Anesthesia Quick Evaluation

## 2020-01-22 NOTE — H&P (Addendum)
Donna Page is a 39 y.o. female presenting for SROM and labor for rpt  csection. OB History    Gravida  3   Para  1   Term  1   Preterm      AB  1   Living  1     SAB  1   TAB      Ectopic      Multiple  0   Live Births  1          Past Medical History:  Diagnosis Date  . AMA (advanced maternal age) multigravida 35+   . Anovulation   . Asthma   . Gestational diabetes   . Hypertension   . Hypothyroidism    Past Surgical History:  Procedure Laterality Date  . CESAREAN SECTION N/A 07/25/2017   Procedure: CESAREAN SECTION;  Surgeon: Olivia Mackie, MD;  Location: [facility] BIRTHING SUITES;  Service: Obstetrics;  Laterality: N/A;  . WISDOM TOOTH EXTRACTION     Family History: family history includes Diabetes in her maternal grandmother; Heart disease in her paternal grandfather. Social History:  reports that she has never smoked. She has never used smokeless tobacco. She reports previous alcohol use. She reports that she does not use drugs.     Maternal Diabetes: Yes:  Diabetes Type:  Diet controlled Genetic Screening: Abnormal:  Results: Other:45 X by NIPS, NO amnio Maternal Ultrasounds/Referrals: Other:MFM Fetal Ultrasounds or other Referrals:  NoneMFM- CYSTIC HYGROMA, NL fetal echo Maternal Substance Abuse:  Yes:  Type: Other:  Significant Maternal Medications:  Meds include: Syntroid Significant Maternal Lab Results:  Group B Strep negative Other Comments:  None  Review of Systems  Constitutional: Negative.   All other systems reviewed and are negative.  Maternal Medical History:  Reason for admission: Rupture of membranes and contractions.   Contractions: Onset was 1-2 hours ago.   Frequency: regular.   Perceived severity is mild.    Fetal activity: Perceived fetal activity is normal.   Last perceived fetal movement was within the past hour.    Prenatal complications: no prenatal complications Prenatal Complications - Diabetes:  gestational. Diabetes is managed by diet.      Dilation: 1 Effacement (%): 50 Exam by:: Irish Elders, RN Blood pressure 138/88, pulse 86, temperature 98.2 F (36.8 C), temperature source Oral, resp. rate 18, last menstrual period 04/26/2019, SpO2 99 %, unknown if currently breastfeeding. Maternal Exam:  Uterine Assessment: Contraction strength is mild.  Contraction frequency is regular.   Abdomen: Patient reports no abdominal tenderness. Surgical scars: low transverse.   Fetal presentation: vertex  Introitus: Normal vulva. Normal vagina.  Ferning test: positive.  Nitrazine test: positive. Amniotic fluid character: clear.  Pelvis: questionable for delivery.   Cervix: Cervix evaluated by digital exam.     Physical Exam  Nursing note and vitals reviewed. Constitutional: She is oriented to person, place, and time. She appears well-developed and well-nourished.  HENT:  Head: Normocephalic and atraumatic.  Cardiovascular: Normal rate and regular rhythm.  Respiratory: Effort normal and breath sounds normal.  GI: Soft. Bowel sounds are normal.  Genitourinary:    Vulva, vagina and uterus normal.   Musculoskeletal:        General: Normal range of motion.     Cervical back: Normal range of motion and neck supple.  Neurological: She is alert and oriented to person, place, and time. She has normal reflexes.  Skin: Skin is warm and dry.  Psychiatric: She has a normal mood and affect.  Prenatal labs: ABO, Rh: O/Positive/-- (09/22 0000) Antibody: Negative (09/22 0000) Rubella: Immune (09/22 0000) RPR: Nonreactive (09/22 0000)  HBsAg: Negative (09/22 0000)  HIV: Non-reactive (09/22 0000)  GBS:   neg  Assessment/Plan: Term IUP SROM in labor Previous csection for rpt. Fetal anomaly- 45X by NIPS Rpt csection. Surgical risks discussed.  Consent done.    Torrence Hammack J 01/22/2020, 3:51 AM

## 2020-01-22 NOTE — Anesthesia Procedure Notes (Signed)
Spinal  Patient location during procedure: OR Start time: 01/22/2020 5:24 AM End time: 01/22/2020 5:26 AM Staffing Performed: anesthesiologist  Anesthesiologist: Leilani Able, MD Preanesthetic Checklist Completed: patient identified, IV checked, site marked, risks and benefits discussed, surgical consent, monitors and equipment checked, pre-op evaluation and timeout performed Spinal Block Patient position: sitting Prep: DuraPrep and site prepped and draped Patient monitoring: continuous pulse ox and blood pressure Approach: midline Location: L3-4 Injection technique: single-shot Needle Needle type: Pencan  Needle gauge: 24 G Needle length: 10 cm Needle insertion depth: 5 cm Assessment Sensory level: T4

## 2020-01-22 NOTE — Lactation Note (Signed)
This note was copied from a baby's chart. Lactation Consultation Note  Patient Name: Donna Page [id number] Date: 01/22/2020 Reason for consult: Initial assessment;Early term 37-38.6wks;Maternal endocrine disorder Type of Endocrine Disorder?: Diabetes(GDM (diet) and hypothyroid (on Synthroid))  Visited with mom of a 10 hours old ETI female who is being exclusively BF. She is a P2 and experienced BF, she told LC she BF her first child for 9 months. Mom has a Hx of hypothyroid (on Synthroid) and GDM (diet controled) baby's serum glucose reading were WNL at 61 and 75 respectively. Mom has 2 DEBP at home, a Spectra and a Medela one.  Baby has lymphedema and possible Turner syndrome (further testing is needed) but it has not interfere with her ability to feed at the breast so far. Per mom BF is going well and baby is latching. Offered assistance with latch and mom agreed to wake baby up to feed, she was going to be due soon for her next feeding.  Mom took baby STS to her left breast in cross cradle position, but shallow was latch and mom has positioned her hands on baby in a way that made the latch very shallow. LC assisted with repositioning baby showing mom the "sandwich" hold and she got a much deeper latch, a few audible swallows noted during the 11 minutes feeding. Baby still feeding when exiting the room.  Reviewed normal newborn behavior, feeding cues and size of baby's stomach. LC offered to set up a pump but mom doesn't feel like she needs to start pumping now, baby has been latching on consistently; baby is also > 6 lbs. Asked mom to call for assistance when needed.  Feeding plan:  1. Encouraged mom to feed baby STS 8-12 times/24 hours or sooner if feeding cues are present 2. Hand expression and spoon feeding were also encouraged 3. Mom will wake baby up to feed if she has not cued by the 3 hour mark  BF brochure, BF resources and feeding diary were reviewed. Dad present but he was  asleep on the couch. Mom reported all questions and concerns were answered, they're both aware of LC OP services and will call PRN.   Maternal Data Formula Feeding for Exclusion: No Has patient been taught Hand Expression?: Yes Does the patient have breastfeeding experience prior to this delivery?: Yes  Feeding Feeding Type: Breast Fed  LATCH Score Latch: Repeated attempts needed to sustain latch, nipple held in mouth throughout feeding, stimulation needed to elicit sucking reflex.  Audible Swallowing: A few with stimulation  Type of Nipple: Everted at rest and after stimulation  Comfort (Breast/Nipple): Soft / non-tender  Hold (Positioning): Assistance needed to correctly position infant at breast and maintain latch.  LATCH Score: 7  Interventions Interventions: Breast feeding basics reviewed;Assisted with latch;Skin to skin;Breast compression;Adjust position;Support pillows  Lactation Tools Discussed/Used WIC Program: No   Consult Status Consult Status: Follow-up Date: 01/23/20 Follow-up type: In-patient    Romain Erion Venetia Constable 01/22/2020, 4:23 PM

## 2020-01-22 NOTE — Transfer of Care (Signed)
Immediate Anesthesia Transfer of Care Note  Patient: Donna Page  Procedure(s) Performed: Repeat CESAREAN SECTION (N/A )  Patient Location: PACU  Anesthesia Type:Spinal  Level of Consciousness: awake, alert  and oriented  Airway & Oxygen Therapy: Patient connected to nasal cannula oxygen  Post-op Assessment: Report given to RN and Post -op Vital signs reviewed and stable  Post vital signs: Reviewed and stable  Last Vitals:  Vitals Value Taken Time  BP 123/66 01/22/20 0630  Temp    Pulse 82 01/22/20 0631  Resp 15 01/22/20 0631  SpO2 99 % 01/22/20 0631  Vitals shown include unvalidated device data.  Last Pain:  Vitals:   01/22/20 0310  TempSrc:   PainSc: 8          Complications: No apparent anesthesia complications

## 2020-01-22 NOTE — MAU Note (Signed)
Pt reports to MAU for ROM and some CTX, pt reports her water broke around 1:50am, clear fluid with some VB, reports good fetal movement.

## 2020-01-23 ENCOUNTER — Encounter: Payer: Self-pay | Admitting: *Deleted

## 2020-01-23 DIAGNOSIS — O359XX0 Maternal care for (suspected) fetal abnormality and damage, unspecified, not applicable or unspecified: Secondary | ICD-10-CM | POA: Diagnosis present

## 2020-01-23 LAB — BIRTH TISSUE RECOVERY COLLECTION (PLACENTA DONATION)

## 2020-01-23 MED ORDER — COCONUT OIL OIL: 1.0000 "application " | TOPICAL_OIL | 0 refills | Status: AC | PRN

## 2020-01-23 MED ORDER — SENNOSIDES-DOCUSATE SODIUM 8.6-50 MG PO TABS: 2.0000 | ORAL_TABLET | ORAL | Status: AC

## 2020-01-23 MED ORDER — SIMETHICONE 80 MG PO CHEW: 80.0000 mg | CHEWABLE_TABLET | ORAL | 0 refills | Status: AC | PRN

## 2020-01-23 MED ORDER — OXYCODONE-ACETAMINOPHEN 5-325 MG PO TABS: 1.0000 | ORAL_TABLET | ORAL | 0 refills | Status: AC | PRN

## 2020-01-23 MED ORDER — IBUPROFEN 800 MG PO TABS: 800.0000 mg | ORAL_TABLET | Freq: Four times a day (QID) | ORAL | 0 refills | Status: AC

## 2020-01-23 MED ORDER — ACETAMINOPHEN 500 MG PO TABS: 1000.0000 mg | ORAL_TABLET | Freq: Four times a day (QID) | ORAL | 0 refills | Status: AC

## 2020-01-23 NOTE — Progress Notes (Signed)
Patient ID: Donna Page, female   DOB: 1981/04/07, 39 y.o.   MRN: [id number] Subjective: POD# 1 Live born female  Birth Weight: 6 lb 10 oz (3005 g) APGAR: 8, 9  Newborn Delivery   Birth date/time: 01/22/2020 05:45:00 Delivery type: C-Section, Low Transverse Trial of labor: No C-section categorization: Repeat     Baby name: Sheran Lawless Delivering provider: Olivia Mackie   Feeding: breast  Pain control at delivery: Spinal   Reports feeling well, desires DC home today.  Patient reports tolerating PO.   Breast symptoms: + colostrum Pain controlled with PO meds Denies HA/SOB/C/P/N/V/dizziness. Flatus present. She reports vaginal bleeding as normal, without clots.  She is ambulating, urinating without difficulty.     Objective:   VS:    Vitals:   01/22/20 1415 01/22/20 1820 01/22/20 2300 01/23/20 0500  BP:  116/72 114/67 103/69  Pulse:  76 (!) 58 73  Resp: 18 18 17 16   Temp: 98.4 F (36.9 C) 98.2 F (36.8 C) 98.3 F (36.8 C) 98.4 F (36.9 C)  TempSrc: Oral Oral Oral Oral  SpO2: 99% 97% 100% 100%  Weight:      Height:          Intake/Output Summary (Last 24 hours) at 01/23/2020 1015 Last data filed at 01/22/2020 2300 Gross per 24 hour  Intake 2682.6 ml  Output 1525 ml  Net 1157.6 ml        Recent Labs    01/22/20 0345 01/23/20 0605  WBC 9.1 11.2*  HGB 13.7 10.7*  HCT 41.3 33.0*  PLT 218 187     Blood type: --/--/O POS, O POS Performed at [facility], [address], [place], [place]  [phone])  Rubella: Immune (09/22 0000)  Vaccines: TDaP UTD         Flu    UTD   Physical Exam:  General: alert, cooperative and no distress CV: Regular rate and rhythm Resp: clear Abdomen: soft, nontender, normal bowel sounds Incision: intact and serous drainage present 30% Uterine Fundus: firm, below umbilicus, nontender Lochia: minimal Ext: no edema, redness or tenderness in the calves or thighs      Assessment/Plan: 39 y.o.   POD# 1.  30                  Principal Problem:   Postpartum care following cesarean delivery 4/11 Active Problems:   Cesarean delivery - Indication: SROM in labor for repeat   Previous cesarean section   Fetal anomaly - monosomy X  - newborn stable, breastfeeding well, cardiac echo completed, has F/U with peds tomorrow AM  Doing well, stable.    Change dressing today - honeycomb             DC home today w/ instructions  F/U at [facility] OB[facility]N office in 6 weeks and PRN   [facility], CNM, MSN 01/23/2020, 10:15 AM

## 2020-01-23 NOTE — Discharge Summary (Signed)
OB Discharge Summary  Patient Name: Donna Page DOB: [birth year] MRN: [id number]  Date of admission: 01/22/2020 Delivering provider: Brien Few   Date of discharge: 01/23/2020  Admitting diagnosis: Previous cesarean section [Z98.891] Previous cesarean delivery affecting pregnancy [O34.219] Intrauterine pregnancy: 110w5d     Secondary diagnosis:Principal Problem:   Postpartum care following cesarean delivery 4/11 Active Problems:   Cesarean delivery - Indication: SROM in labor for repeat   Previous cesarean section   Fetal anomaly - monosomy X  Additional problems:none     Discharge diagnosis:  Patient Active Problem List   Diagnosis Date Noted  . Fetal anomaly - monosomy X 01/23/2020  . Previous cesarean section 01/22/2020  . Cesarean delivery - Indication: SROM in labor for repeat 07/25/2017  . Postpartum care following cesarean delivery 4/11 07/25/2017                                                                Post partum procedures:none  Pain control: Spinal  Complications: None   Hospital course:  Sceduled C/S   39 y.o. yo [id number] at [redacted]w[redacted]d was admitted to the hospital 01/22/2020 for unscheduled cesarean section with the following indication:Elective Repeat and SROM in early labor.  Membrane Rupture Time/Date: 1:50 AM ,01/22/2020   Patient delivered a Viable infant.01/22/2020  Details of operation can be found in separate operative note.  Pateint had an uncomplicated postpartum course.  She is ambulating, tolerating a regular diet, passing flatus, and urinating well. Patient is discharged home in stable condition on  01/23/20         Physical exam  Vitals:   01/22/20 1415 01/22/20 1820 01/22/20 2300 01/23/20 0500  BP:  116/72 114/67 103/69  Pulse:  76 (!) 58 73  Resp: 18 18 17 16   Temp: 98.4 F (36.9 C) 98.2 F (36.8 C) 98.3 F (36.8 C) 98.4 F (36.9 C)  TempSrc: Oral Oral Oral Oral  SpO2: 99% 97% 100% 100%  Weight:      Height:       General: alert,  cooperative and no distress Lochia: appropriate Uterine Fundus: firm Incision: Healing well with no significant drainage, No significant erythema DVT Evaluation: No cords or calf tenderness. No significant calf/ankle edema. Labs: Lab Results  Component Value Date   WBC 11.2 (H) 01/23/2020   HGB 10.7 (L) 01/23/2020   HCT 33.0 (L) 01/23/2020   MCV 90.9 01/23/2020   PLT 187 01/23/2020   CMP Latest Ref Rng & Units 07/24/2017  Glucose 65 - 99 mg/dL 85  BUN 6 - 20 mg/dL 9  Creatinine 0.44 - 1.00 mg/dL 0.50  Sodium 135 - 145 mmol/L 137  Potassium 3.5 - 5.1 mmol/L 3.4(L)  Chloride 101 - 111 mmol/L 105  CO2 22 - 32 mmol/L 20(L)  Calcium 8.9 - 10.3 mg/dL 8.6(L)  Total Protein 6.5 - 8.1 g/dL 6.6  Total Bilirubin 0.3 - 1.2 mg/dL 0.2(L)  Alkaline Phos 38 - 126 U/L 160(H)  AST 15 - 41 U/L 21  ALT 14 - 54 U/L 15   Edinburgh Postnatal Depression Scale Screening Tool 01/22/2020 07/26/2017  I have been able to laugh and see the funny side of things. (No Data) 0  I have looked forward with enjoyment to things. - 0  I have blamed myself unnecessarily  when things went wrong. - 1  I have been anxious or worried for no good reason. - 1  I have felt scared or panicky for no good reason. - 1  Things have been getting on top of me. - 0  I have been so unhappy that I have had difficulty sleeping. - 0  I have felt sad or miserable. - 0  I have been so unhappy that I have been crying. - 0  The thought of harming myself has occurred to me. - 0  Edinburgh Postnatal Depression Scale Total - 3   Vaccines: TDaP UTD         Flu    UTD  Discharge instruction: per After Visit Summary and "Baby and Me Booklet".  After Visit Meds:  Allergies as of 01/23/2020   No Known Allergies     Medication List    STOP taking these medications   aspirin EC 81 MG tablet   ferrous sulfate 325 (65 FE) MG tablet     TAKE these medications   acetaminophen 500 MG tablet Commonly known as: TYLENOL Take 2 tablets  (1,000 mg total) by mouth every 6 (six) hours.   coconut oil Oil Apply 1 application topically as needed.   ibuprofen 800 MG tablet Commonly known as: ADVIL Take 1 tablet (800 mg total) by mouth every 6 (six) hours. What changed:   medication strength  how much to take   levothyroxine 75 MCG tablet Commonly known as: SYNTHROID Take 75 mcg by mouth daily before breakfast.   multivitamin-prenatal 27-0.8 MG Tabs tablet Take 1 tablet by mouth every evening.   oxyCODONE-acetaminophen 5-325 MG tablet Commonly known as: PERCOCET/ROXICET Take 1-2 tablets by mouth every 4 (four) hours as needed for moderate pain.   senna-docusate 8.6-50 MG tablet Commonly known as: Senokot-S Take 2 tablets by mouth daily. Start taking on: January 24, 2020   simethicone 80 MG chewable tablet Commonly known as: MYLICON Chew 1 tablet (80 mg total) by mouth as needed for flatulence.            Discharge Care Instructions  (From admission, onward)         Start     Ordered   01/23/20 0000  Discharge wound care:    Comments: Remove dressing in 4 days. Remove underlying steristrips after 2 more days.   01/23/20 1150          Diet: routine diet  Activity: Advance as tolerated. Pelvic rest for 6 weeks.   Postpartum contraception: Not Discussed  Newborn Data: Live born female  Birth Weight: 6 lb 10 oz (3005 g) APGAR: 8, 9  Newborn Delivery   Birth date/time: 01/22/2020 05:45:00 Delivery type: C-Section, Low Transverse Trial of labor: No C-section categorization: Repeat      named Madeline Baby Feeding: Breast Disposition:home with mother   Delivery Report:  Review the Delivery Report for details.    Follow up: Follow-up Information    Olivia Mackie, MD. Schedule an appointment as soon as possible for a visit in 6 week(s).   Specialty: Obstetrics and Gynecology Contact information: [address] [place] [address] [phone]              Signed: Cipriano Mile, MSN 01/23/2020, 11:53 AM

## 2020-01-24 LAB — CBC
HCT: 33 % — ABNORMAL LOW (ref 36.0–46.0)
Hemoglobin: 10.7 g/dL — ABNORMAL LOW (ref 12.0–15.0)
MCH: 29.5 pg (ref 26.0–34.0)
MCHC: 32.4 g/dL (ref 30.0–36.0)
MCV: 90.9 fL (ref 80.0–100.0)
Platelets: 187 10*3/uL (ref 150–400)
RBC: 3.63 MIL/uL — ABNORMAL LOW (ref 3.87–5.11)
RDW: 13 % (ref 11.5–15.5)
WBC: 11.2 10*3/uL — ABNORMAL HIGH (ref 4.0–10.5)
nRBC: 0 % (ref 0.0–0.2)

## 2020-01-31 ENCOUNTER — Other Ambulatory Visit (HOSPITAL_COMMUNITY)
Admission: RE | Admit: 2020-01-31 | Discharge: 2020-01-31 | Disposition: A | Discharge status: Home / Self Care | Payer: Commercial Managed Care - PPO | Source: Ambulatory Visit | Attending: Obstetrics and Gynecology | Admitting: Obstetrics and Gynecology

## 2020-01-31 HISTORY — DX: Supervision of elderly multigravida, unspecified trimester: O09.529

## 2020-02-02 ENCOUNTER — Inpatient Hospital Stay (HOSPITAL_COMMUNITY)
Admission: RE | Admit: 2020-02-02 | Payer: Commercial Managed Care - PPO | Source: Home / Self Care | Admitting: Obstetrics and Gynecology

## 2020-02-06 NOTE — Anesthesia Postprocedure Evaluation (Signed)
Anesthesia Post Note  Patient: Flo Berroa  Procedure(s) Performed: Repeat CESAREAN SECTION (N/A )     Patient location during evaluation: PACU Anesthesia Type: Spinal Level of consciousness: awake Pain management: pain level controlled Vital Signs Assessment: post-procedure vital signs reviewed and stable Respiratory status: spontaneous breathing Cardiovascular status: stable Postop Assessment: no headache, no backache, spinal receding, patient able to bend at knees and no apparent nausea or vomiting Anesthetic complications: no    Last Vitals:  Vitals:   01/22/20 2300 01/23/20 0500  BP: 114/67 103/69  Pulse: (!) 58 73  Resp: 17 16  Temp: 36.8 C 36.9 C  SpO2: 100% 100%    Last Pain:  Vitals:   01/23/20 1458  TempSrc:   PainSc: 0-No pain   Pain Goal: Patients Stated Pain Goal: 3 (01/22/20 1820)                 Caren Macadam

## 2020-02-07 ENCOUNTER — Inpatient Hospital Stay (HOSPITAL_COMMUNITY): Admit: 2020-02-07 | Payer: Self-pay

## 2020-10-06 IMAGING — US US MFM OB LIMITED
1 series · 14 of 21 positions shown · non-contrast
Comparison: none

[Series 1: us mfm ob limited · 14 of 21 slices shown]
[im 1/21]
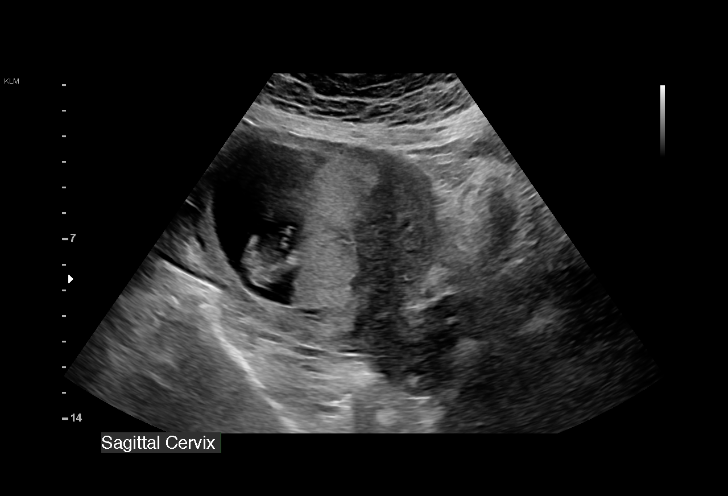
[im 3/21]
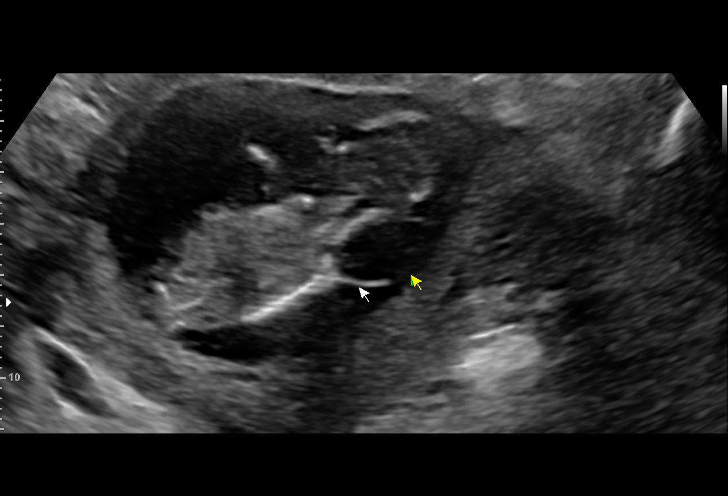
[im 4/21]
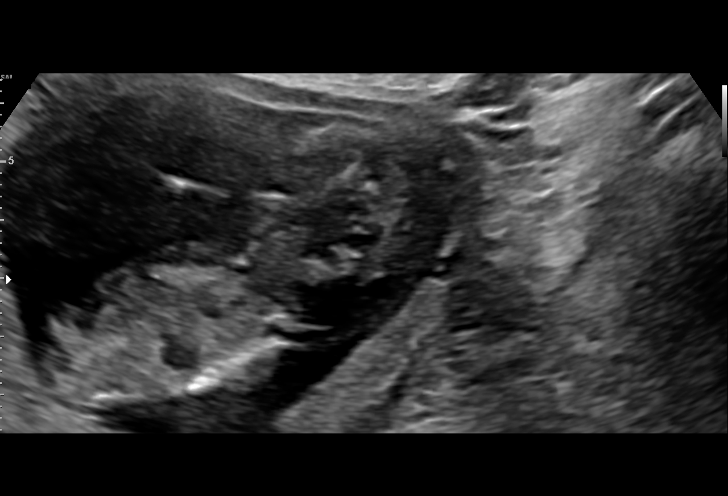
[im 6/21]
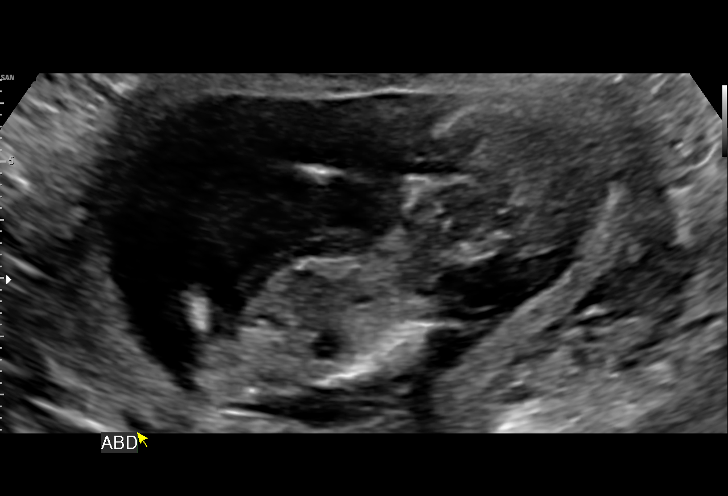
[im 7/21]
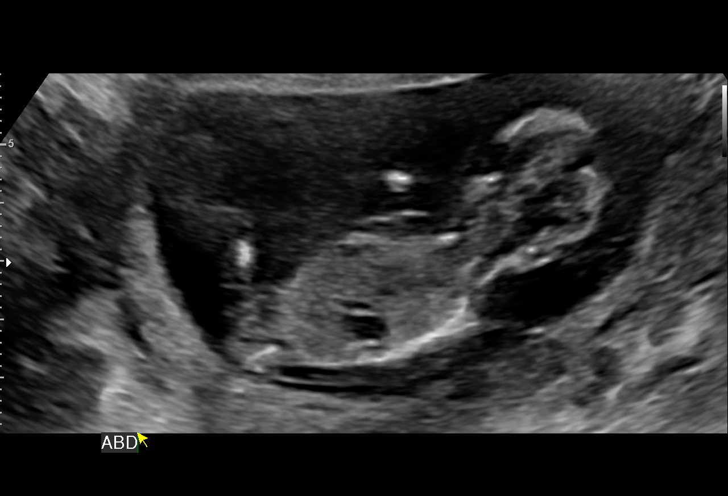
[im 9/21]
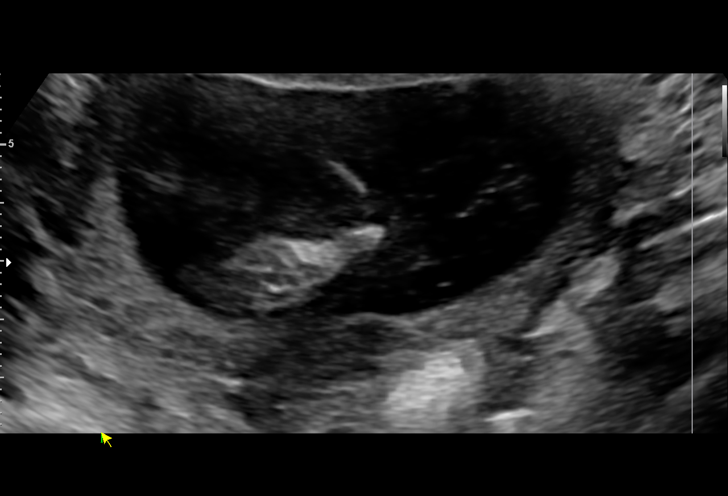
[im 10/21]
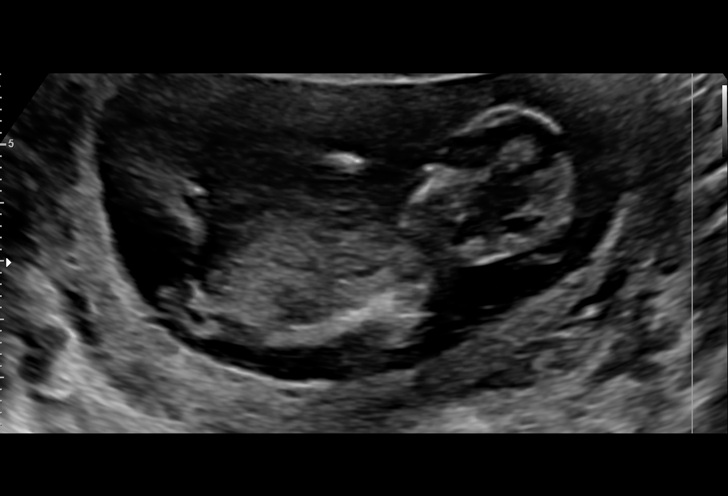
[im 12/21]
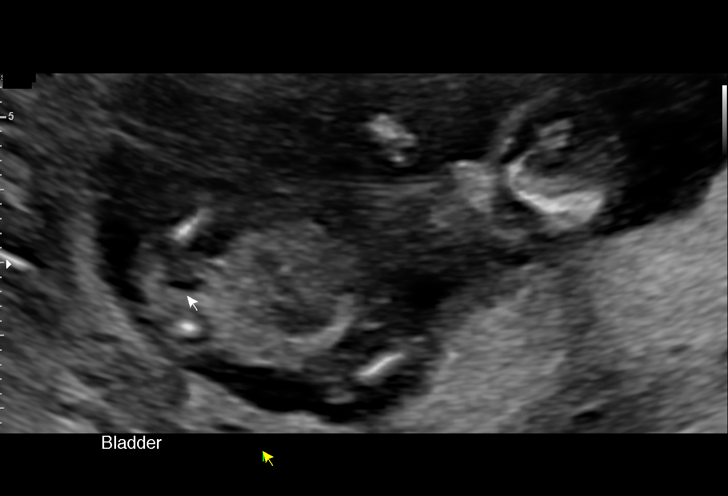
[im 13/21]
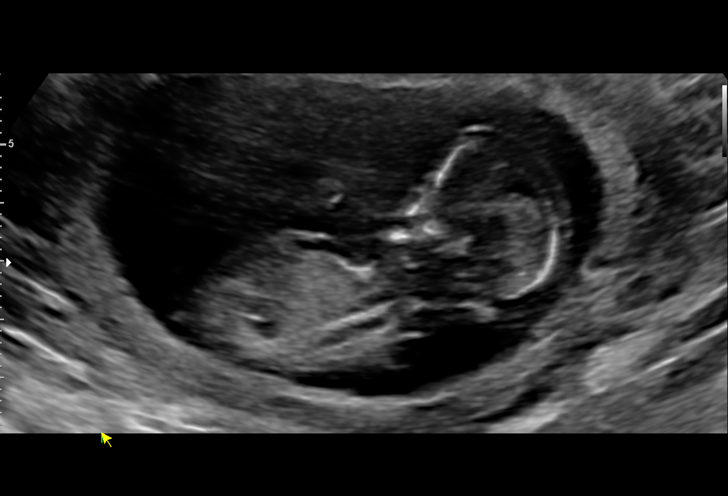
[im 15/21]
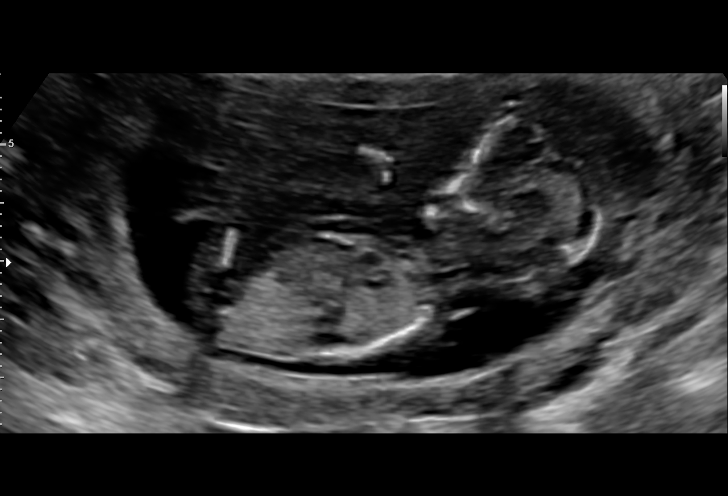
[im 16/21]
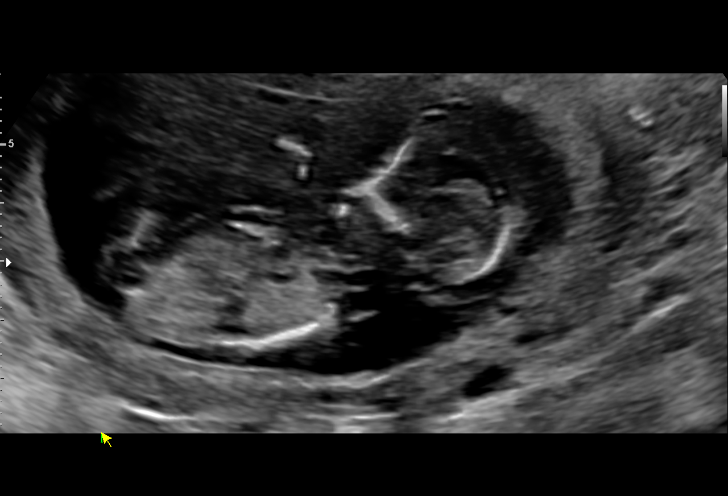
[im 18/21]
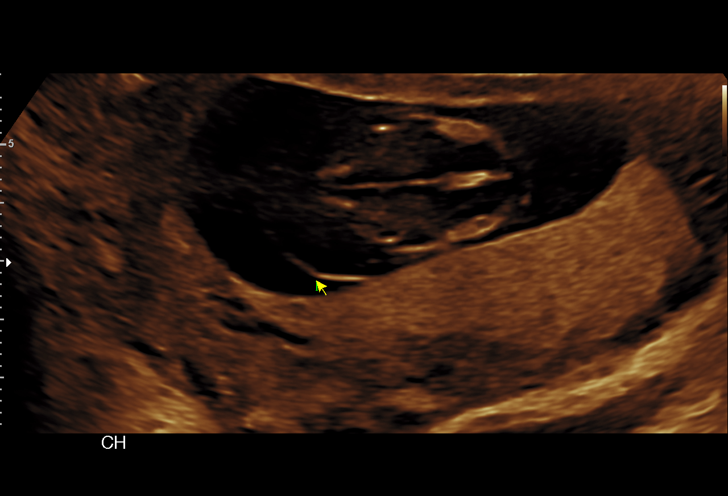
[im 19/21]
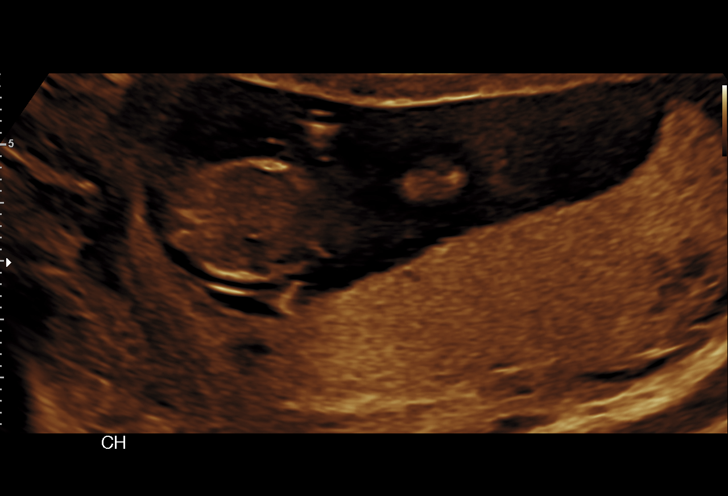
[im 21/21]
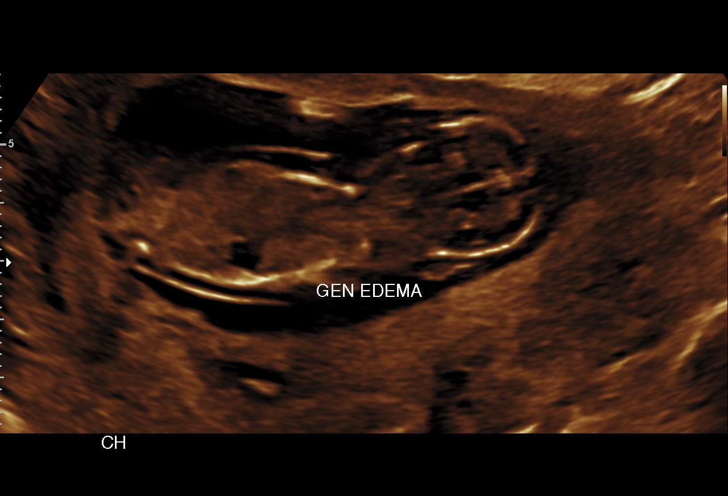

[14 of 21 positions shown; findings below may reference images not displayed]

& Infertility
                                                            [id number] [REDACTED]

 ----------------------------------------------------------------------

 ----------------------------------------------------------------------
Indications

  Fetal abnormality - other known or
  suspected (hygroma, hydrops)
  13 weeks gestation of pregnancy
  Abnormal finding on antenatal screening
  (NIPS Monosomy X)
 ----------------------------------------------------------------------
Fetal Evaluation

 Num Of Fetuses:         1
 Fetal Heart Rate(bpm):  159
 Cardiac Activity:       Observed
 Presentation:           Variable
 Placenta:               Anterior

 Amniotic Fluid
 AFI FV:      Within normal limits
OB History

 Gravidity:    3         Term:   1        Prem:   0        SAB:   1
 TOP:          0       Ectopic:  0        Living: 1
Gestational Age

 LMP:           14w 2d        Date:  04/26/19                 EDD:   01/31/20
 Best:          13w 2d     Det. By:  Early Ultrasound         EDD:   02/07/20
                                     (06/21/19)
Anatomy
 Nuchal Fold:           Cystic hygroma         Bladder:                Appears normal
 Stomach:               Appears normal, left
                        sided

 Other:  Generalized edema visualized.
Cervix Uterus Adnexa

 Cervix
 Closed.
Impression

 Ms. Francheska with fetal cystic hygroma returned for ultrasound
 evaluation. On cell-free fetal DNA screening, the risk for
 monosomy X was increased. Patient met with our genetic
 counselor last week (see separate letter).

 On ultrasound, large cystic hygroma with generalized body
 wall edema is seen. Normal fetal heart activity is seen.

 I explained the findings and we discussed the options
 including CVS/amniocentesis or expectant management.
 Patient informed that termination of pregnancy is not an
 option.

 I recommended invasive testing (preferably amniocentesis
 since termination is not an option and is likely to avoid a
 mosaic result). Cell-free fetal DNA screening is not diagnostic
 and cystic hygroma may represent other chromosomal
 anomalies or genetic syndromes.

 I also recommended a detailed fetal anatomical survey scan
 with us at 19 weeks. We will also set up an appointment for
 fetal echocardiography.
Recommendations

 -An appointment was made for her to return in 6 weeks for
 fetal anatomy scan.
                 Gmez, Aleida Maria

## 2020-11-21 IMAGING — US US MFM OB DETAIL+14 WK
1 series · 12 of 28 positions shown · non-contrast
Comparison: none

[Series 1: us mfm ob detail+14 wk · 12 of 127 slices shown]
[im 5/127]
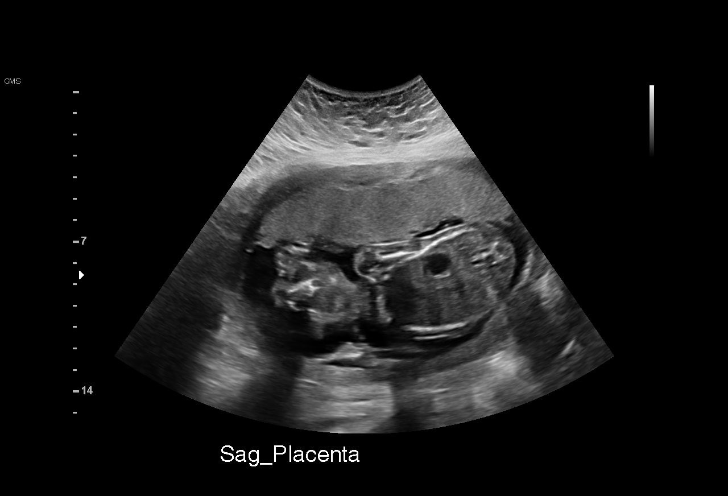
[im 15/127]
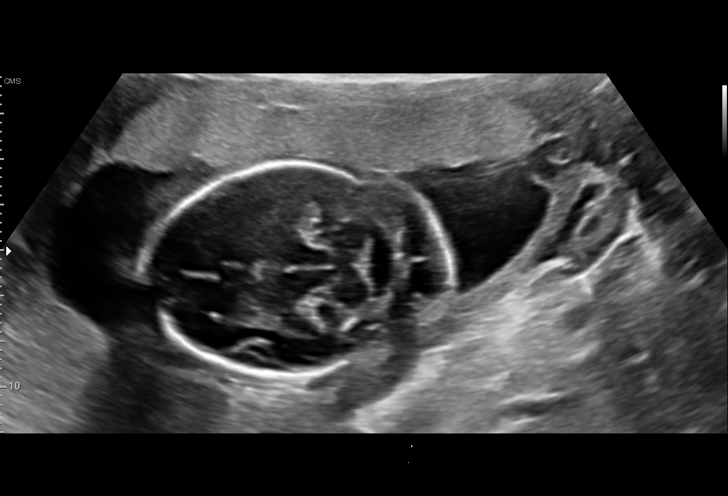
[im 24/127]
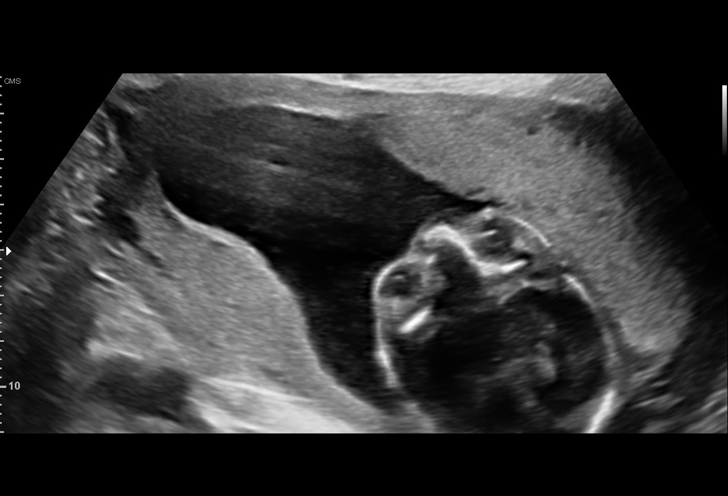
[im 38/127]
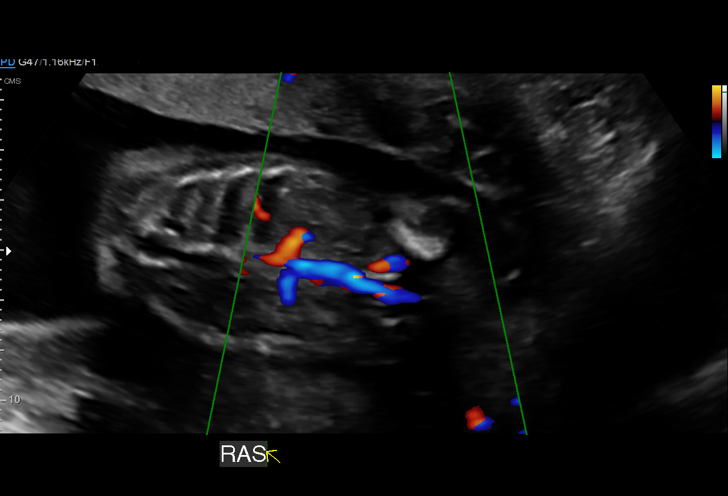
[im 47/127]
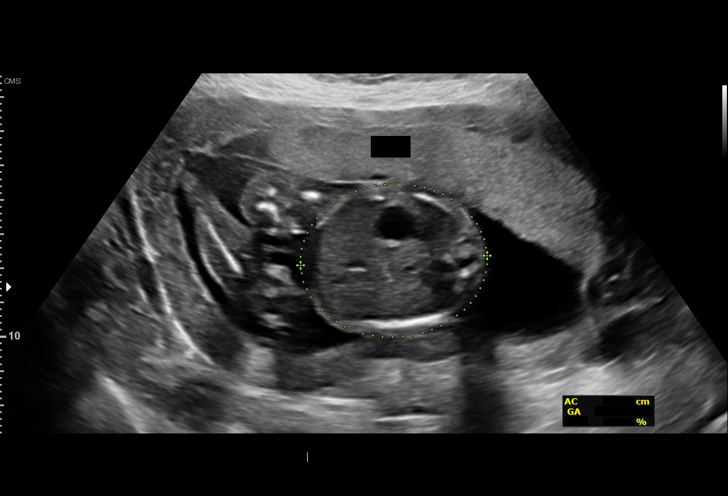
[im 57/127]
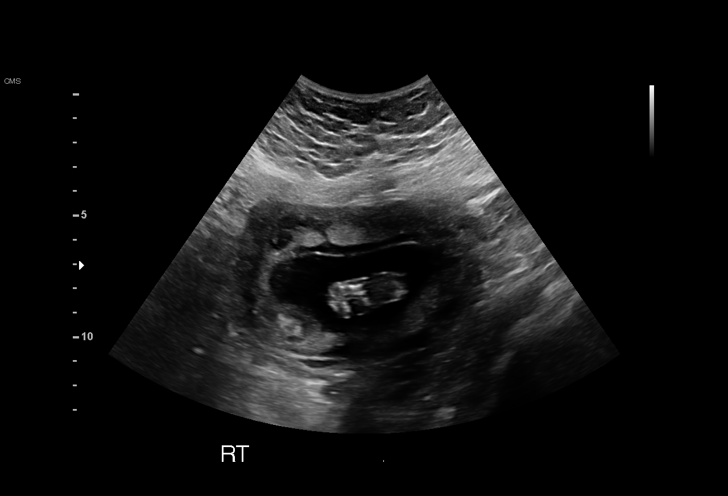
[im 71/127]
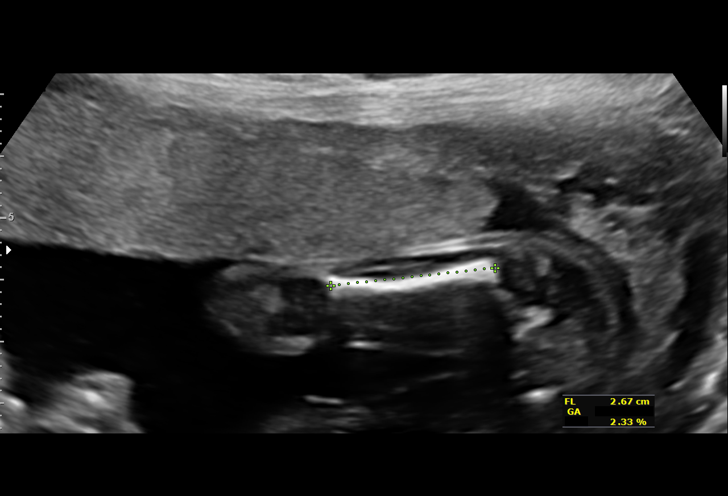
[im 80/127]
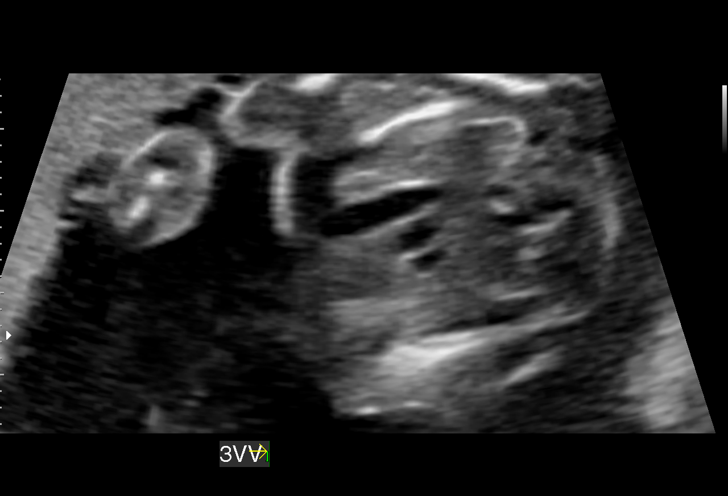
[im 89/127]
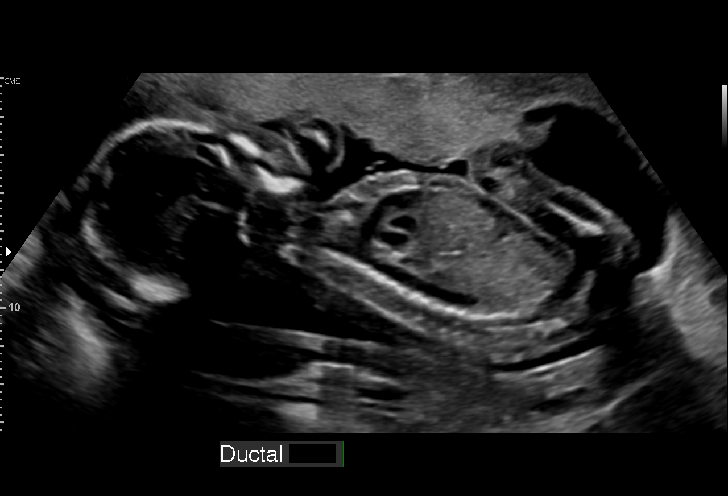
[im 103/127]
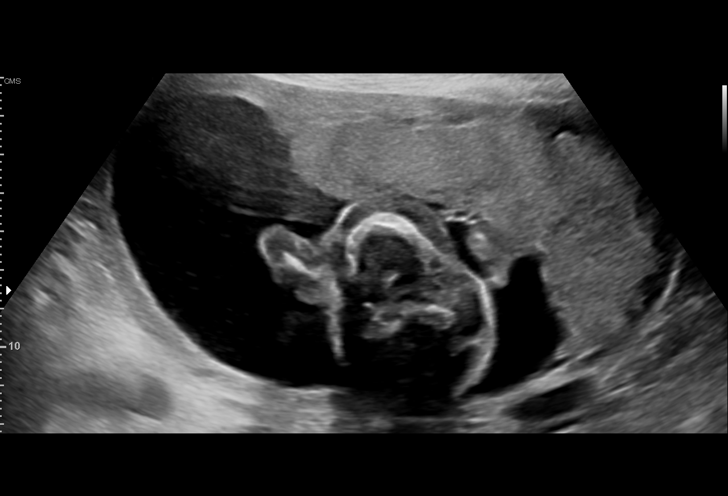
[im 113/127]
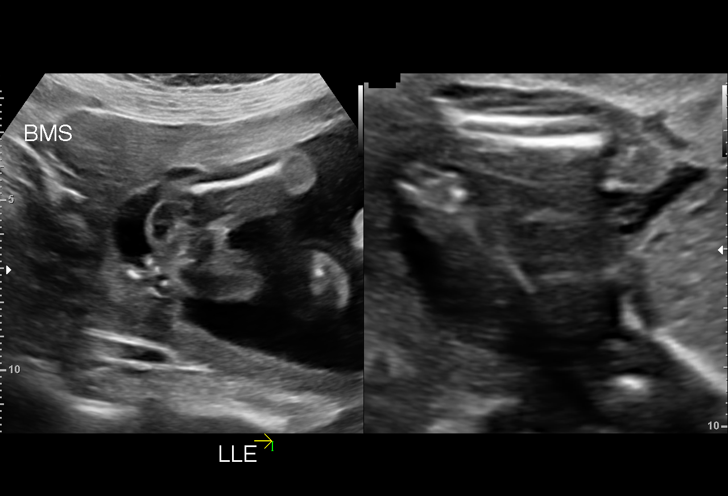
[im 122/127]
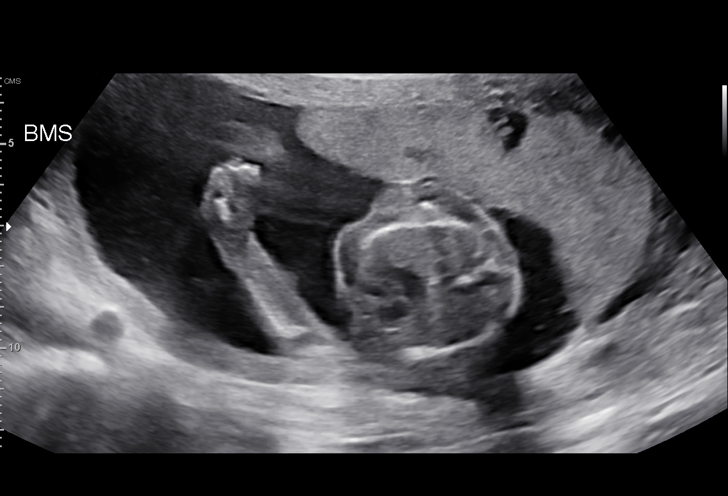

[12 of 28 positions shown; findings below may reference images not displayed]

& Infertility
                                                            [id number] [REDACTED]

 ----------------------------------------------------------------------

 ----------------------------------------------------------------------
Indications

  Abnormal finding on antenatal screening
  (NIPS Monosomy X)
  Advanced maternal age multigravida 35+,
  second trimester
  Fetal abnormality - other known or
  suspected (hygroma, hydrops)
  Antenatal screening for malformations
  19 weeks gestation of pregnancy
 ----------------------------------------------------------------------
Fetal Evaluation

 Num Of Fetuses:         1
 Fetal Heart Rate(bpm):  161
 Cardiac Activity:       Observed
 Presentation:           Breech
 Placenta:               Anterior
 P. Cord Insertion:      Visualized

 Amniotic Fluid
 AFI FV:      Within normal limits

                             Largest Pocket(cm)

Biometry

 BPD:      46.1  mm     G. Age:  20w 0d         53  %    CI:        67.19   %    70 - 86
                                                         FL/HC:      15.0   %    16.8 -
 HC:      180.1  mm     G. Age:  20w 3d         69  %    HC/AC:      1.15        1.09 -
 AC:      156.3  mm     G. Age:  20w 6d         75  %    FL/BPD:     58.8   %
 FL:       27.1  mm     G. Age:  18w 2d          4  %    FL/AC:      17.3   %    20 - 24
 HUM:      26.3  mm     G. Age:  18w 2d         12  %
 CER:      20.8  mm     G. Age:  19w 6d         49  %
 NFT:       9.4  mm

 CM:          5  mm

 Est. FW:     309  gm    0 lb 11 oz      37  %
OB History

 Gravidity:    3         Term:   1        Prem:   0        SAB:   1
 TOP:          0       Ectopic:  0        Living: 1
Gestational Age

 LMP:           20w 6d        Date:  04/26/19                 EDD:   01/31/20
 U/S Today:     19w 6d                                        EDD:   02/07/20
 Best:          19w 6d     Det. By:  Early Ultrasound         EDD:   02/07/20
                                     (06/21/19)
Anatomy

 Cranium:               Appears normal         LVOT:                   Appears normal
 Cavum:                 Appears normal         Aortic Arch:            Appears normal
 Ventricles:            Appears normal         Ductal Arch:            Appears normal
 Choroid Plexus:        Appears normal         Diaphragm:              Appears normal
 Cerebellum:            Appears normal         Stomach:                Appears normal, left
                                                                       sided
 Posterior Fossa:       Appears normal         Abdomen:                Appears normal
 Nuchal Fold:           Cystic hygroma         Abdominal Wall:         Appears nml (cord
                                                                       insert, abd wall)
 Face:                  Appears normal         Cord Vessels:           Appears normal (3
                        (orbits and profile)                           vessel cord)
 Lips:                  Appears normal         Kidneys:                Appear normal
 Palate:                Appears normal         Bladder:                Appears normal
 Thoracic:              Appears normal         Spine:                  Appears normal
 Heart:                 Appears normal         Upper Extremities:      Appears normal
                        (4CH, axis, and
                        situs)
 RVOT:                  Appears normal

 Other:  Heels/feet and open hands/5th digits visualized. Nasal bone
         visualized.
Cervix Uterus Adnexa

 Cervix
 Length:           3.89  cm.
 Normal appearance by transabdominal scan.

 Uterus
 No abnormality visualized.

 Left Ovary
 Not visualized.

 Right Ovary
 Not visualized.

 Cul De Sac
 No free fluid seen.
 Adnexa
 No abnormality visualized.
Comments

 This patient was seen for a detailed fetal anatomy scan due
 to advanced maternal age and a fetus with a cystic hygroma
 that was noted earlier in her pregnancy.  The patient had a
 cell free DNA test earlier in her pregnancy which indicated an
 increased risk for Turner syndrome (45 X0).
 She denies any significant past medical history and denies
 any problems in her current pregnancy.
 She was informed that the fetal growth and amniotic fluid
 level were appropriate for her gestational age.
 There were no obvious fetal anomalies noted on today's
 ultrasound exam.  The cystic hygroma appears to be
 resolving.  The remnants of the effects of the cystic hygroma
 continues to be noted on today's exam.  An enlarged nuchal
 fold and also edema in the fetal face and lower extremities
 continues to be noted.  There were no signs of abdominal or
 thoracic ascites noted today.
 The patient was informed that anomalies may be missed due
 to technical limitations. If the fetus is in a suboptimal position
 or maternal habitus is increased, visualization of the fetus in
 the maternal uterus may be impaired.
 The patient was advised that as her cell free DNA test
 indicated an increased risk of Turner syndrome along with the
 cystic hygroma noted earlier in her pregnancy, there is a very
 high likelihood that her baby has Turner syndrome.  She was
 once again offered and declined an amniocentesis for
 definitive diagnosis of fetal aneuploidy today.  The patient
 stated that a fetus with Turner syndrome would not change
 the management of her current pregnancy.
 The patient was reassured that girls who are born with Turner
 syndrome will usually live a normal life.  These girls will most
 likely require growth hormones at the time of puberty.
 ovaries however, they do have a normal uterus and may be
 able to carry a pregnancy that is conceived through in vitro
 fertilization using a donor egg.
 Although the cystic hygroma appears to be resolving, the
 patient was advised regarding the increased risk of an IUFD
 in fetuses with a cystic hygroma.  She was advised to
 continue to monitor fetal Alia Tiger.
 Due to the previously noted cystic hygroma and the
 association of congenital heart defects in fetuses with a cystic
 hygroma, she was referred to [facility] for a
 fetal echocardiogram.
 The patient was advised that we will continue to follow her
 closely with you throughout her pregnancy.
 A follow-up exam was scheduled in 4 weeks.
 A total of 15 minutes was spent counseling and coordinating
 the care for this patient.  Greater than 50% of the time was
 spent in direct face-to-face contact.
